# Patient Record
Sex: Female | Born: 1980 | Race: Black or African American | Hispanic: No | Marital: Married | Smoking: Never smoker
Health system: Southern US, Community
[De-identification: ages and names within clinical notes are randomized; demographics above are authoritative.]

## PROBLEM LIST (undated history)

## (undated) DIAGNOSIS — D649 Anemia, unspecified: Secondary | ICD-10-CM

## (undated) DIAGNOSIS — M1990 Unspecified osteoarthritis, unspecified site: Secondary | ICD-10-CM

## (undated) DIAGNOSIS — T7840XA Allergy, unspecified, initial encounter: Secondary | ICD-10-CM

## (undated) DIAGNOSIS — J069 Acute upper respiratory infection, unspecified: Secondary | ICD-10-CM

## (undated) DIAGNOSIS — T783XXA Angioneurotic edema, initial encounter: Secondary | ICD-10-CM

## (undated) DIAGNOSIS — L509 Urticaria, unspecified: Secondary | ICD-10-CM

## (undated) DIAGNOSIS — K219 Gastro-esophageal reflux disease without esophagitis: Secondary | ICD-10-CM

## (undated) HISTORY — DX: Allergy, unspecified, initial encounter: T78.40XA

## (undated) HISTORY — PX: TUBAL LIGATION: SHX77

## (undated) HISTORY — DX: Acute upper respiratory infection, unspecified: J06.9

## (undated) HISTORY — DX: Urticaria, unspecified: L50.9

## (undated) HISTORY — DX: Gastro-esophageal reflux disease without esophagitis: K21.9

## (undated) HISTORY — DX: Anemia, unspecified: D64.9

## (undated) HISTORY — DX: Angioneurotic edema, initial encounter: T78.3XXA

## (undated) HISTORY — PX: FOOT SURGERY: SHX648

## (undated) HISTORY — DX: Unspecified osteoarthritis, unspecified site: M19.90

---

## 2005-04-03 ENCOUNTER — Inpatient Hospital Stay (HOSPITAL_COMMUNITY): Admission: AD | Admit: 2005-04-03 | Discharge: 2005-04-06 | Payer: Self-pay | Admitting: Obstetrics and Gynecology

## 2005-05-18 ENCOUNTER — Other Ambulatory Visit: Admission: RE | Admit: 2005-05-18 | Discharge: 2005-05-18 | Payer: Self-pay | Admitting: Obstetrics and Gynecology

## 2005-06-01 ENCOUNTER — Ambulatory Visit (HOSPITAL_COMMUNITY): Admission: RE | Admit: 2005-06-01 | Discharge: 2005-06-01 | Payer: Self-pay | Admitting: Obstetrics and Gynecology

## 2006-06-01 ENCOUNTER — Other Ambulatory Visit: Admission: RE | Admit: 2006-06-01 | Discharge: 2006-06-01 | Payer: Self-pay | Admitting: Obstetrics and Gynecology

## 2010-12-12 ENCOUNTER — Other Ambulatory Visit: Payer: Self-pay | Admitting: Obstetrics and Gynecology

## 2011-10-19 ENCOUNTER — Telehealth: Payer: Self-pay

## 2011-10-19 NOTE — Telephone Encounter (Signed)
PT WOULD LIKE TO COME BY AND P/U HER IMMUNIZATIONS. PLEASE CALL [phone]

## 2011-10-20 NOTE — Telephone Encounter (Signed)
No immunizations in patient chart. Left message on patient machine.

## 2011-11-12 ENCOUNTER — Encounter: Payer: Self-pay | Admitting: Family Medicine

## 2015-10-11 DIAGNOSIS — B373 Candidiasis of vulva and vagina: Secondary | ICD-10-CM | POA: Diagnosis not present

## 2015-10-11 DIAGNOSIS — N760 Acute vaginitis: Secondary | ICD-10-CM | POA: Diagnosis not present

## 2015-11-07 DIAGNOSIS — L700 Acne vulgaris: Secondary | ICD-10-CM | POA: Diagnosis not present

## 2015-12-05 DIAGNOSIS — L700 Acne vulgaris: Secondary | ICD-10-CM | POA: Diagnosis not present

## 2015-12-21 ENCOUNTER — Ambulatory Visit (INDEPENDENT_AMBULATORY_CARE_PROVIDER_SITE_OTHER): Payer: BLUE CROSS/BLUE SHIELD | Admitting: Physician Assistant

## 2015-12-21 VITALS — BP 118/80 | HR 71 | Temp 98.6°F | Resp 16 | Ht 66.0 in | Wt 166.0 lb

## 2015-12-21 DIAGNOSIS — R8290 Unspecified abnormal findings in urine: Secondary | ICD-10-CM | POA: Diagnosis not present

## 2015-12-21 DIAGNOSIS — B379 Candidiasis, unspecified: Secondary | ICD-10-CM | POA: Diagnosis not present

## 2015-12-21 DIAGNOSIS — R350 Frequency of micturition: Secondary | ICD-10-CM

## 2015-12-21 DIAGNOSIS — N898 Other specified noninflammatory disorders of vagina: Secondary | ICD-10-CM | POA: Diagnosis not present

## 2015-12-21 LAB — POC MICROSCOPIC URINALYSIS (UMFC)

## 2015-12-21 LAB — POCT URINALYSIS DIP (MANUAL ENTRY)
Bilirubin, UA: NEGATIVE
Glucose, UA: NEGATIVE
Ketones, POC UA: NEGATIVE
NITRITE UA: NEGATIVE
PH UA: 6.5
PROTEIN UA: NEGATIVE
Spec Grav, UA: 1.02
UROBILINOGEN UA: 0.2

## 2015-12-21 LAB — POCT WET + KOH PREP
Trich by wet prep: ABSENT
YEAST BY WET PREP: ABSENT

## 2015-12-21 MED ORDER — FLUCONAZOLE 150 MG PO TABS: 150.0000 mg | ORAL_TABLET | Freq: Once | ORAL | Status: DC

## 2015-12-21 NOTE — Patient Instructions (Signed)
     IF you received an x-ray today, you will receive an invoice from [facility]. Please contact [facility] at [phone] with questions or concerns regarding your invoice.   IF you received labwork today, you will receive an invoice from [facility]. Please contact [facility] at [phone] with questions or concerns regarding your invoice.   Our billing staff will not be able to assist you with questions regarding bills from these companies.  You will be contacted with the lab results as soon as they are available. The fastest way to get your results is to activate your My Chart account. Instructions are located on the last page of this paperwork. If you have not heard from us regarding the results in 2 weeks, please contact this office.      

## 2015-12-21 NOTE — Progress Notes (Signed)
Roberta Stewart  MRN: [id number] DOB: December 23, 1980  Subjective:  Pt presents to clinic with concerns that she might have BV.  She has had the symptoms for about a week but she is traveling out of town and does not want to travel with this.  She is having vaginal discharge that is slightly yellow in color and thicker than normal.  She is sexually active with her husband and she is not worried about STDs - she has been tested within the last 6 months.  She did have some urinary frequency but she thinks that it was related to her trial of spironolactone for acne.  Last diagnosis of BV was about 2 months ago - they have talked about boric acid but they have not done it yet.  There are no active problems to display for this patient.   No current outpatient prescriptions on file prior to visit.   No current facility-administered medications on file prior to visit.    No Known Allergies  Review of Systems  Constitutional: Negative for fever and chills.  Genitourinary: Positive for vaginal discharge (yellowish thick - some odor, slight itching). Negative for dysuria, urgency and frequency.   Objective:  BP 118/80 mmHg  Pulse 71  Temp(Src) 98.6 F (37 C) (Oral)  Resp 16  Ht  (1.676 m)  Wt 166 lb (75.297 kg)  BMI 26.81 kg/m2  SpO2 99%  LMP 12/04/2015 (Approximate)  Physical Exam  Constitutional: She is oriented to person, place, and time and well-developed, well-nourished, and in no distress.  HENT:  Head: Normocephalic and atraumatic.  Right Ear: Hearing and external ear normal.  Left Ear: Hearing and external ear normal.  Eyes: Conjunctivae are normal.  Neck: Normal range of motion.  Pulmonary/Chest: Effort normal.  Neurological: She is alert and oriented to person, place, and time. Gait normal.  Skin: Skin is warm and dry.  Psychiatric: Mood, memory, affect and judgment normal.  Vitals reviewed.   Results for orders placed or performed in visit on 12/21/15  POCT  Microscopic Urinalysis ([facility])  Result Value Ref Range   WBC,UR,HPF,POC Few (A) None WBC/hpf   RBC,UR,HPF,POC Few (A) None RBC/hpf   Bacteria Few (A) None, Too numerous to count   Mucus Present (A) Absent   Epithelial Cells, UR Per Microscopy Few (A) None, Too numerous to count cells/hpf  POCT urinalysis dipstick  Result Value Ref Range   Color, UA yellow yellow   Clarity, UA clear clear   Glucose, UA negative negative   Bilirubin, UA negative negative   Ketones, POC UA negative negative   Spec Grav, UA 1.020    Blood, UA trace-intact (A) negative   pH, UA 6.5    Protein Ur, POC negative negative   Urobilinogen, UA 0.2    Nitrite, UA Negative Negative   Leukocytes, UA small (1+) (A) Negative  POCT Wet + KOH Prep  Result Value Ref Range   Yeast by KOH Present Present, Absent   Yeast by wet prep Absent Present, Absent   WBC by wet prep Few None, Few, Too numerous to count   Clue Cells Wet Prep HPF POC None None, Too numerous to count   Trich by wet prep Absent Present, Absent   Bacteria Wet Prep HPF POC Moderate (A) None, Few, Too numerous to count   Epithelial Cells By [employer] ([facility]) Few None, Few, Too numerous to count   RBC,UR,HPF,POC None None RBC/hpf    Assessment and Plan :  Abnormal  urine odor - Plan: POCT Microscopic Urinalysis ([facility]), POCT urinalysis dipstick - normal urine - not a clean catch - expect her urine frquency to be related to her diuretic  Urinary frequency -   Vaginal discharge - Plan: POCT Wet + KOH Prep  Yeast infection - Plan: fluconazole (DIFLUCAN) 150 MG tablet, Care order/instruction     Benny LennertSarah Dillinger Aston PA-C  [facility] 12/21/2015 10:40 AM

## 2016-02-24 DIAGNOSIS — R1030 Lower abdominal pain, unspecified: Secondary | ICD-10-CM | POA: Diagnosis not present

## 2016-02-26 DIAGNOSIS — Z6826 Body mass index (BMI) 26.0-26.9, adult: Secondary | ICD-10-CM | POA: Diagnosis not present

## 2016-02-26 DIAGNOSIS — R350 Frequency of micturition: Secondary | ICD-10-CM | POA: Diagnosis not present

## 2016-02-26 DIAGNOSIS — Z01419 Encounter for gynecological examination (general) (routine) without abnormal findings: Secondary | ICD-10-CM | POA: Diagnosis not present

## 2016-02-26 DIAGNOSIS — Z1151 Encounter for screening for human papillomavirus (HPV): Secondary | ICD-10-CM | POA: Diagnosis not present

## 2016-02-26 DIAGNOSIS — N898 Other specified noninflammatory disorders of vagina: Secondary | ICD-10-CM | POA: Diagnosis not present

## 2016-03-03 DIAGNOSIS — R350 Frequency of micturition: Secondary | ICD-10-CM | POA: Diagnosis not present

## 2016-03-03 DIAGNOSIS — R3915 Urgency of urination: Secondary | ICD-10-CM | POA: Diagnosis not present

## 2016-03-23 DIAGNOSIS — Z0000 Encounter for general adult medical examination without abnormal findings: Secondary | ICD-10-CM | POA: Diagnosis not present

## 2016-05-04 DIAGNOSIS — R0981 Nasal congestion: Secondary | ICD-10-CM | POA: Diagnosis not present

## 2016-05-15 DIAGNOSIS — J3081 Allergic rhinitis due to animal (cat) (dog) hair and dander: Secondary | ICD-10-CM | POA: Diagnosis not present

## 2016-05-15 DIAGNOSIS — J320 Chronic maxillary sinusitis: Secondary | ICD-10-CM | POA: Diagnosis not present

## 2016-05-15 DIAGNOSIS — J301 Allergic rhinitis due to pollen: Secondary | ICD-10-CM | POA: Diagnosis not present

## 2016-05-15 DIAGNOSIS — J321 Chronic frontal sinusitis: Secondary | ICD-10-CM | POA: Diagnosis not present

## 2016-05-20 DIAGNOSIS — J301 Allergic rhinitis due to pollen: Secondary | ICD-10-CM | POA: Diagnosis not present

## 2016-05-20 DIAGNOSIS — J040 Acute laryngitis: Secondary | ICD-10-CM | POA: Diagnosis not present

## 2016-05-20 DIAGNOSIS — J039 Acute tonsillitis, unspecified: Secondary | ICD-10-CM | POA: Diagnosis not present

## 2016-06-15 DIAGNOSIS — N920 Excessive and frequent menstruation with regular cycle: Secondary | ICD-10-CM | POA: Diagnosis not present

## 2016-06-15 DIAGNOSIS — Z113 Encounter for screening for infections with a predominantly sexual mode of transmission: Secondary | ICD-10-CM | POA: Diagnosis not present

## 2016-06-25 DIAGNOSIS — J301 Allergic rhinitis due to pollen: Secondary | ICD-10-CM | POA: Diagnosis not present

## 2016-06-25 DIAGNOSIS — J3081 Allergic rhinitis due to animal (cat) (dog) hair and dander: Secondary | ICD-10-CM | POA: Diagnosis not present

## 2016-06-25 DIAGNOSIS — H6123 Impacted cerumen, bilateral: Secondary | ICD-10-CM | POA: Diagnosis not present

## 2016-06-25 DIAGNOSIS — H6061 Unspecified chronic otitis externa, right ear: Secondary | ICD-10-CM | POA: Diagnosis not present

## 2016-06-26 DIAGNOSIS — N920 Excessive and frequent menstruation with regular cycle: Secondary | ICD-10-CM | POA: Diagnosis not present

## 2016-07-28 DIAGNOSIS — J069 Acute upper respiratory infection, unspecified: Secondary | ICD-10-CM | POA: Diagnosis not present

## 2016-08-01 DIAGNOSIS — Z713 Dietary counseling and surveillance: Secondary | ICD-10-CM | POA: Diagnosis not present

## 2016-08-01 DIAGNOSIS — Z13220 Encounter for screening for lipoid disorders: Secondary | ICD-10-CM | POA: Diagnosis not present

## 2016-08-01 DIAGNOSIS — Z136 Encounter for screening for cardiovascular disorders: Secondary | ICD-10-CM | POA: Diagnosis not present

## 2016-08-01 DIAGNOSIS — Z6826 Body mass index (BMI) 26.0-26.9, adult: Secondary | ICD-10-CM | POA: Diagnosis not present

## 2016-11-28 DIAGNOSIS — S93601A Unspecified sprain of right foot, initial encounter: Secondary | ICD-10-CM | POA: Diagnosis not present

## 2016-12-01 ENCOUNTER — Ambulatory Visit (INDEPENDENT_AMBULATORY_CARE_PROVIDER_SITE_OTHER): Payer: BLUE CROSS/BLUE SHIELD | Admitting: Orthopedic Surgery

## 2016-12-09 ENCOUNTER — Ambulatory Visit (INDEPENDENT_AMBULATORY_CARE_PROVIDER_SITE_OTHER): Payer: BLUE CROSS/BLUE SHIELD | Admitting: Orthopedic Surgery

## 2016-12-09 ENCOUNTER — Ambulatory Visit (INDEPENDENT_AMBULATORY_CARE_PROVIDER_SITE_OTHER): Payer: BLUE CROSS/BLUE SHIELD

## 2016-12-09 ENCOUNTER — Encounter (INDEPENDENT_AMBULATORY_CARE_PROVIDER_SITE_OTHER): Payer: Self-pay | Admitting: Orthopedic Surgery

## 2016-12-09 VITALS — Ht 66.0 in | Wt 166.0 lb

## 2016-12-09 DIAGNOSIS — M6701 Short Achilles tendon (acquired), right ankle: Secondary | ICD-10-CM | POA: Insufficient documentation

## 2016-12-09 DIAGNOSIS — M79671 Pain in right foot: Secondary | ICD-10-CM | POA: Diagnosis not present

## 2016-12-09 NOTE — Progress Notes (Signed)
Office Visit Note   Patient: Roberta Stewart           Date of Birth: 1981-01-02           MRN: [id number] Visit Date: 12/09/2016              Requested by: Renford DillsPolite, Ronald, MD [address] [place], [place] [address] PCP: Renford DillsPolite, Ronald, MD  Chief Complaint  Patient presents with  . Right Foot - Pain      HPI: Patient is a 36 year old woman who states she's been having right foot pain for several months she states initially she had pain around the great toe MTP joint later she had pain beneath the fifth metatarsal head which was worse with activities. Patient states that now when she crosses her legs and lays on her foot sideways she gets some pain in the third webspace. She states that her foot feels tight she has used ice and Tylenol without relief.  Assessment & Plan: Visit Diagnoses:  1. Pain in right foot   2. Achilles tendon contracture, right     Plan: Recommended heel cord stretching she should do this in that it is time 5 times a day this was demonstrated to her. Recommended sole orthotics to unload pressure from the forefoot.  Follow-Up Instructions: Return if symptoms worsen or fail to improve.   Ortho Exam  Patient is alert, oriented, no adenopathy, well-dressed, normal affect, normal respiratory effort. Examination patient has a normal gait. She has good ankle good subtalar motion she does have heel cord tightness with dorsiflexion only to neutral with her knee extended. She has good pulses. There is no pain with subtalar motion. The MTP joint of the great toe is nontender to palpation the lesser toe MTP joints are nontender to palpation. She has no pain with palpation over the web space. With lateral compression she does have a little bit of pain in the Morton's neuroma. She has no plantar calluses or ulcers.  Imaging: Xr Foot 2 Views Right  Result Date: 12/09/2016 2 view radiographs of the right foot shows a long second and third metatarsal.  Patient has no bunion deformity no bunionette deformity. She has no bipartite sesamoid. She has normal arch alignment no talar beaking.   Labs: No results found for: HGBA1C, ESRSEDRATE, CRP, LABURIC, REPTSTATUS, GRAMSTAIN, CULT, LABORGA  Orders:  Orders Placed This Encounter  Procedures  . XR Foot 2 Views Right   No orders of the defined types were placed in this encounter.    Procedures: No procedures performed  Clinical Data: No additional findings.  ROS:  All other systems negative, except as noted in the HPI. Review of Systems  Objective: Vital Signs: Ht 5\' 6"  (1.676 m)   Wt 166 lb (75.3 kg)   BMI 26.79 kg/m   Specialty Comments:  No specialty comments available.  PMFS History: Patient Active Problem List   Diagnosis Date Noted  . Pain in right foot 12/09/2016  . 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 Allergy   . Anemia   . Arthritis     Family History  Problem Relation Age of Onset  . Heart disease Maternal Grandmother   . Diabetes Maternal Grandfather   . Diabetes Paternal Grandfather     Past Surgical History:  Procedure Laterality Date  . TUBAL LIGATION     Social History   Occupational History  . Not on file.  Social History Main Topics  . Smoking status: Never Smoker  . Smokeless tobacco: Never Used  . Alcohol use 0.0 oz/week  . Drug use: No  . Sexual activity: Not on file

## 2017-01-15 DIAGNOSIS — H6123 Impacted cerumen, bilateral: Secondary | ICD-10-CM | POA: Diagnosis not present

## 2017-02-14 DIAGNOSIS — S233XXA Sprain of ligaments of thoracic spine, initial encounter: Secondary | ICD-10-CM | POA: Diagnosis not present

## 2017-03-29 DIAGNOSIS — Z01419 Encounter for gynecological examination (general) (routine) without abnormal findings: Secondary | ICD-10-CM | POA: Diagnosis not present

## 2017-03-29 DIAGNOSIS — Z1151 Encounter for screening for human papillomavirus (HPV): Secondary | ICD-10-CM | POA: Diagnosis not present

## 2017-03-29 DIAGNOSIS — Z6826 Body mass index (BMI) 26.0-26.9, adult: Secondary | ICD-10-CM | POA: Diagnosis not present

## 2017-03-30 DIAGNOSIS — E559 Vitamin D deficiency, unspecified: Secondary | ICD-10-CM | POA: Diagnosis not present

## 2017-03-30 DIAGNOSIS — M84374A Stress fracture, right foot, initial encounter for fracture: Secondary | ICD-10-CM | POA: Diagnosis not present

## 2017-04-08 DIAGNOSIS — M84374D Stress fracture, right foot, subsequent encounter for fracture with routine healing: Secondary | ICD-10-CM | POA: Diagnosis not present

## 2017-04-08 DIAGNOSIS — E559 Vitamin D deficiency, unspecified: Secondary | ICD-10-CM | POA: Diagnosis not present

## 2017-05-04 DIAGNOSIS — Z13220 Encounter for screening for lipoid disorders: Secondary | ICD-10-CM | POA: Diagnosis not present

## 2017-05-04 DIAGNOSIS — Z0000 Encounter for general adult medical examination without abnormal findings: Secondary | ICD-10-CM | POA: Diagnosis not present

## 2017-05-04 DIAGNOSIS — Z23 Encounter for immunization: Secondary | ICD-10-CM | POA: Diagnosis not present

## 2017-05-04 DIAGNOSIS — E559 Vitamin D deficiency, unspecified: Secondary | ICD-10-CM | POA: Diagnosis not present

## 2017-08-21 DIAGNOSIS — Z13220 Encounter for screening for lipoid disorders: Secondary | ICD-10-CM | POA: Diagnosis not present

## 2017-08-21 DIAGNOSIS — Z713 Dietary counseling and surveillance: Secondary | ICD-10-CM | POA: Diagnosis not present

## 2017-08-21 DIAGNOSIS — Z6829 Body mass index (BMI) 29.0-29.9, adult: Secondary | ICD-10-CM | POA: Diagnosis not present

## 2017-08-21 DIAGNOSIS — Z136 Encounter for screening for cardiovascular disorders: Secondary | ICD-10-CM | POA: Diagnosis not present

## 2017-09-27 DIAGNOSIS — J069 Acute upper respiratory infection, unspecified: Secondary | ICD-10-CM | POA: Diagnosis not present

## 2017-09-27 DIAGNOSIS — R5383 Other fatigue: Secondary | ICD-10-CM | POA: Diagnosis not present

## 2017-09-27 DIAGNOSIS — M2560 Stiffness of unspecified joint, not elsewhere classified: Secondary | ICD-10-CM | POA: Diagnosis not present

## 2017-09-27 DIAGNOSIS — F439 Reaction to severe stress, unspecified: Secondary | ICD-10-CM | POA: Diagnosis not present

## 2017-10-12 DIAGNOSIS — N760 Acute vaginitis: Secondary | ICD-10-CM | POA: Diagnosis not present

## 2017-11-23 DIAGNOSIS — D171 Benign lipomatous neoplasm of skin and subcutaneous tissue of trunk: Secondary | ICD-10-CM | POA: Diagnosis not present

## 2017-12-10 DIAGNOSIS — R3915 Urgency of urination: Secondary | ICD-10-CM | POA: Diagnosis not present

## 2017-12-10 DIAGNOSIS — B373 Candidiasis of vulva and vagina: Secondary | ICD-10-CM | POA: Diagnosis not present

## 2018-02-20 DIAGNOSIS — T63441A Toxic effect of venom of bees, accidental (unintentional), initial encounter: Secondary | ICD-10-CM | POA: Diagnosis not present

## 2018-02-24 ENCOUNTER — Encounter (HOSPITAL_COMMUNITY): Payer: Self-pay | Admitting: Emergency Medicine

## 2018-02-24 ENCOUNTER — Emergency Department (HOSPITAL_COMMUNITY)
Admission: EM | Admit: 2018-02-24 | Discharge: 2018-02-25 | Disposition: A | Discharge status: Home / Self Care | Payer: BLUE CROSS/BLUE SHIELD | Attending: Emergency Medicine | Admitting: Emergency Medicine

## 2018-02-24 ENCOUNTER — Other Ambulatory Visit: Payer: Self-pay

## 2018-02-24 ENCOUNTER — Emergency Department (HOSPITAL_COMMUNITY): Payer: BLUE CROSS/BLUE SHIELD

## 2018-02-24 DIAGNOSIS — Z79899 Other long term (current) drug therapy: Secondary | ICD-10-CM | POA: Insufficient documentation

## 2018-02-24 DIAGNOSIS — M79661 Pain in right lower leg: Secondary | ICD-10-CM | POA: Diagnosis not present

## 2018-02-24 DIAGNOSIS — L03115 Cellulitis of right lower limb: Secondary | ICD-10-CM

## 2018-02-24 DIAGNOSIS — B379 Candidiasis, unspecified: Secondary | ICD-10-CM

## 2018-02-24 DIAGNOSIS — T63461D Toxic effect of venom of wasps, accidental (unintentional), subsequent encounter: Secondary | ICD-10-CM | POA: Diagnosis not present

## 2018-02-24 DIAGNOSIS — T7840XA Allergy, unspecified, initial encounter: Secondary | ICD-10-CM | POA: Diagnosis not present

## 2018-02-24 DIAGNOSIS — M25561 Pain in right knee: Secondary | ICD-10-CM | POA: Diagnosis not present

## 2018-02-24 DIAGNOSIS — R0602 Shortness of breath: Secondary | ICD-10-CM | POA: Diagnosis not present

## 2018-02-24 MED ORDER — OXYCODONE-ACETAMINOPHEN 5-325 MG PO TABS
1.0000 | ORAL_TABLET | Freq: Once | ORAL | Status: AC
  Administered 2018-02-24: 1 via ORAL
  Filled 2018-02-24: qty 1

## 2018-02-24 MED ORDER — ONDANSETRON 4 MG PO TBDP
4.0000 mg | ORAL_TABLET | Freq: Once | ORAL | Status: AC
  Administered 2018-02-24: 4 mg via ORAL
  Filled 2018-02-24: qty 1

## 2018-02-24 NOTE — ED Provider Notes (Signed)
[facility]-EMERGENCY DEPT Provider Note  CSN: [id number] Arrival date & time: 02/24/18  2054   History   Chief Complaint Chief Complaint  Patient presents with  . Leg Pain    HPI Honor Junesiffany B Millstein is a 37 y.o. female with a medical history of anemia who presented to the ED for right leg pain x2 weeks. She describes redness, warmth and pain with walking. Patient reports being stung by a yellow jacket on the posterior aspect of her right leg 1.5 weeks ago. Since then, she reports increased swelling and redness at the site. She reports being treated for cellulitis by PCP on 02/20/18 for this, but states that the pain in her right leg has worsened and it is now painful to ambulate. She also endorses calf pain and swelling. Patient reports that she is sedentary with her job and is currently on birth control. Denies fever, chest pain, SOB, palpitations, new skin rashes/lesions and other arthralgias.  Past Medical History:  Diagnosis Date  . Allergy   . Anemia   . Arthritis     Patient Active Problem List   Diagnosis Date Noted  . Pain in right foot 12/09/2016  . 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 FOOT SURGERY    . TUBAL LIGATION       OB History   None      Home Medications    Prior to Admission medications   Medication Sig Start Date End Date Taking? Authorizing Provider  cetirizine (ZYRTEC) 10 MG tablet Take 5 mg by mouth daily.   Yes [provider]  fluticasone (FLONASE) 50 MCG/ACT nasal spray Place into both nostrils daily.   Yes [provider]  JUNEL FE 24 1-20 MG-MCG(24) tablet Take 1 tablet by mouth daily. 12/02/17  Yes [provider]  lactobacillus acidophilus & bulgar (LACTINEX) chewable tablet Chew 1 tablet by mouth daily.   Yes [provider]  Multiple Vitamin (MULTIVITAMIN WITH MINERALS) TABS tablet Take 1 tablet by mouth daily.   Yes  [provider]  fluconazole (DIFLUCAN) 150 MG tablet Take 1 tablet (150 mg total) by mouth once for 1 dose. Repeat in 1 week is needed 02/25/18 02/25/18  Mortis, Jerrel IvoryGabrielle I, PA-C  oxyCODONE-acetaminophen (PERCOCET/ROXICET) 5-325 MG tablet Take 2 tablets by mouth every 6 (six) hours as needed for up to 5 days for severe pain. 02/25/18 03/02/18  Mortis, Jerrel IvoryGabrielle I, PA-C  predniSONE (STERAPRED UNI-PAK 21 TAB) 10 MG (21) TBPK tablet Take by mouth daily. Take 6 tabs by mouth daily  for 2 days, then 5 tabs for 2 days, then 4 tabs for 2 days, then 3 tabs for 2 days, 2 tabs for 2 days, then 1 tab by mouth daily for 2 days 02/25/18   Jeannie FendMurphy, Laura A, PA-C    Family History Family History  Problem Relation Age of Onset  . Hypertension Mother   . Thyroid disease Mother   . Heart disease Maternal Grandmother   . Diabetes Maternal Grandfather   . Diabetes Paternal Grandfather     Social History Social History   Tobacco Use  . Smoking status: Never Smoker  . Smokeless tobacco: Never Used  Substance Use Topics  . Alcohol use: Not Currently    Alcohol/week: 0.0 standard drinks  . Drug use: No     Allergies   Keflex [cephalexin] and Darvon [propoxyphene]   Review of Systems Review of Systems  Constitutional: Negative  for chills and fever.  Genitourinary: Negative.   Musculoskeletal: Positive for arthralgias, gait problem and joint swelling.  Skin: Positive for color change.  Neurological: Negative for weakness and numbness.  Hematological: Negative.    Physical Exam Updated Vital Signs BP 118/79   Pulse 79   Temp 97.8 F (36.6 C) (Oral)   Resp 15   Ht 5\' 8"  (1.727 m)   Wt 77.1 kg   LMP 02/03/2018 (Exact Date)   SpO2 99%   BMI 25.85 kg/m   Physical Exam  Constitutional: She appears well-developed and well-nourished.  Grimacing in pain  Musculoskeletal:       Right hip: Normal.       Right knee: She exhibits decreased range of motion, swelling and erythema. Tenderness  found. Lateral joint line tenderness noted.       Right ankle: Normal.       Right upper leg: Normal.       Right lower leg: She exhibits tenderness and swelling. She exhibits no bony tenderness.       Right foot: Normal.  Right knee: Lateral aspect tender to palpation. Knee joint warm to touch and mildly erythematous. No streaking seen. Decreased flexion due to pain. Right calf: Tender to palpation. Increased swelling when compared to right. Remaining lower extremity joints normal with full ROM and non-painful.   Neurological: She has normal strength. She displays no atrophy. No sensory deficit. She exhibits normal muscle tone.  Reflex Scores:      Patellar reflexes are 2+ on the right side and 2+ on the left side.      Achilles reflexes are 2+ on the right side and 2+ on the left side. Skin: Skin is warm and intact. Capillary refill takes less than 2 seconds. There is erythema.  Nursing note and vitals reviewed.  ED Treatments / Results  Labs (all labs ordered are listed, but only abnormal results are displayed) Labs Reviewed - No data to display  EKG None  Radiology Ct Angio Chest Pe W/cm &/or Wo Cm  Result Date: 02/25/2018 CLINICAL DATA:  Shortness of breath.  Bee sting 2 weeks ago. EXAM: CT ANGIOGRAPHY CHEST WITH CONTRAST TECHNIQUE: Multidetector CT imaging of the chest was performed using the standard protocol during bolus administration of intravenous contrast. Multiplanar CT image reconstructions and MIPs were obtained to evaluate the vascular anatomy. CONTRAST:  70 cc Isovue-370 intravenous. COMPARISON:  None. FINDINGS: Cardiovascular: Satisfactory opacification of the pulmonary arteries to the segmental level. No evidence of pulmonary embolism. Normal heart size. No pericardial effusion. Mediastinum/Nodes: Negative Lungs/Pleura: Negative Upper Abdomen: Negative Musculoskeletal: No acute or aggressive finding. Review of the MIP images confirms the above findings. IMPRESSION:  Negative chest CTA. Electronically Signed   By: Marnee Spring M.D.   On: 02/25/2018 10:23   Ct Knee Right Wo Contrast  Result Date: 02/24/2018 CLINICAL DATA:  Right knee pain after being stung by a Ella jacket twice. Pain radiates from right knee down the calf and up into her thigh. EXAM: CT OF THE RIGHT KNEE WITHOUT CONTRAST TECHNIQUE: Multidetector CT imaging of the RIGHT knee was performed according to the standard protocol. Multiplanar CT image reconstructions were also generated. COMPARISON:  None. FINDINGS: Bones/Joint/Cartilage No fracture, malalignment or bone destruction. Maintained joint spaces without significant chondral thinning. Ligaments Suboptimally assessed by CT. Muscles and Tendons No intramuscular edema, hemorrhage or mass. Soft tissues Soft tissue induration is noted along the anterior and lateral aspect of the knee without abnormal fluid collection. IMPRESSION: Cutaneous and  subcutaneous inflammatory response along the anterolateral aspect of the knee believed to account for soft tissue induration noted. No abnormal fluid collection, mass nor acute osseous abnormality. Electronically Signed   By: Tollie Eth M.D.   On: 02/24/2018 23:51    Procedures Procedures (including critical care time)  Medications Ordered in ED Medications  oxyCODONE-acetaminophen (PERCOCET/ROXICET) 5-325 MG per tablet 1 tablet (1 tablet Oral Given 02/24/18 2320)  ondansetron (ZOFRAN-ODT) disintegrating tablet 4 mg (4 mg Oral Given 02/24/18 2321)    Initial Impression / Assessment and Plan / ED Course  Triage vital signs and the nursing notes have been reviewed.  Pertinent labs & imaging results that were available during care of the patient were reviewed and considered in medical decision making (see chart for details).  Patient presents afebrile with right leg pain. Patient reports worsening swelling, erythema and pain despite being on steroids and antibiotics to treat cellulitis. She also endorses  right calf pain and swelling. On exam, there is mild erythema of right knee joint, but no streaking seen. She is able to bear weight and ambulate, but endorses severe pain when doing so. Right upper calf near knee is objectively swollen when compared to the left. Additional DVT risk factors include sedentary lifestyle and exogenous hormone use. Concern for DVT as well as septic arthritis. Will order appropriate imaging to evaluate for both.   Clinical Course as of Feb 26 1512  Fri Feb 25, 2018  0015 CT knee shows cellulitis around anterolateral aspect of right knee. No infection penetrating deep structures of bones or muscles. No fluid collections.   [GM]    Clinical Course User Index [GM] Mortis, Jerrel Ivory I, PA-C   Venous U/S could not be completed tonight due to ultrasound services not available at ED currently. Discussed options to return to [facility] tomorrow 02/25/18 for follow-up with U/S which patient agreed to. Case also discussed with Dr. Virgina Norfolk who agrees with discharge plans detailed below.  Final Clinical Impressions(s) / ED Diagnoses  1. Right Lower Extremity Cellulitis. Switch antibiotics from Keflex to Clindamycin as there was no improvement in symptoms after 4 days with prior antibiotics. Education provided on s/s that would warrant return to the ED. Ambulatory referral for venous U/S to rule out DVT.   Dispo: Home. After thorough clinical evaluation, this patient is determined to be medically stable and can be safely discharged with the previously mentioned treatment and/or outpatient follow-up/referral(s). At this time, there are no other apparent medical conditions that require further screening, evaluation or treatment.   Final diagnoses:  Cellulitis of right lower extremity    ED Discharge Orders         Ordered    LE VENOUS     02/25/18 0005    fluconazole (DIFLUCAN) 150 MG tablet   Once     02/25/18 0027    clindamycin (CLEOCIN) 300 MG capsule  4 times daily,    Status:  Discontinued     02/25/18 0027    oxyCODONE-acetaminophen (PERCOCET/ROXICET) 5-325 MG tablet  Every 6 hours PRN     02/25/18 0027            Windy Carina, PA-C 02/25/18 1516    Virgina Norfolk, DO 02/26/18 1706

## 2018-02-24 NOTE — ED Triage Notes (Signed)
Pt states on the Saturday before last she was stung by a yellow jacket twice  Pt states she went to urgent care this past Sunday and was started on Prednisone, Keflex, and benadryl  Pt states today she is having pain in her right knee that radiates down into her calf and up into her thigh  Tender to palpate  Pt's knee is red and warm to touch  Pt also states she is starting to get red bumps like hives on her body and face

## 2018-02-25 ENCOUNTER — Encounter (HOSPITAL_COMMUNITY): Payer: Self-pay

## 2018-02-25 ENCOUNTER — Other Ambulatory Visit: Payer: Self-pay

## 2018-02-25 ENCOUNTER — Emergency Department (HOSPITAL_BASED_OUTPATIENT_CLINIC_OR_DEPARTMENT_OTHER): Payer: BLUE CROSS/BLUE SHIELD

## 2018-02-25 ENCOUNTER — Emergency Department (HOSPITAL_COMMUNITY): Payer: BLUE CROSS/BLUE SHIELD

## 2018-02-25 ENCOUNTER — Inpatient Hospital Stay (HOSPITAL_COMMUNITY): Admission: RE | Admit: 2018-02-25 | Payer: BLUE CROSS/BLUE SHIELD | Source: Ambulatory Visit

## 2018-02-25 ENCOUNTER — Emergency Department (HOSPITAL_COMMUNITY)
Admission: EM | Admit: 2018-02-25 | Discharge: 2018-02-25 | Disposition: A | Discharge status: Home / Self Care | Payer: BLUE CROSS/BLUE SHIELD | Source: Home / Self Care | Attending: Emergency Medicine | Admitting: Emergency Medicine

## 2018-02-25 DIAGNOSIS — R0602 Shortness of breath: Secondary | ICD-10-CM | POA: Insufficient documentation

## 2018-02-25 DIAGNOSIS — R609 Edema, unspecified: Secondary | ICD-10-CM | POA: Diagnosis not present

## 2018-02-25 DIAGNOSIS — T7840XA Allergy, unspecified, initial encounter: Secondary | ICD-10-CM

## 2018-02-25 DIAGNOSIS — Z79899 Other long term (current) drug therapy: Secondary | ICD-10-CM

## 2018-02-25 LAB — COMPREHENSIVE METABOLIC PANEL
ALBUMIN: 4 g/dL (ref 3.5–5.0)
ALT: 14 U/L (ref 0–44)
AST: 17 U/L (ref 15–41)
Alkaline Phosphatase: 41 U/L (ref 38–126)
Anion gap: 11 (ref 5–15)
BUN: 14 mg/dL (ref 6–20)
CO2: 21 mmol/L — ABNORMAL LOW (ref 22–32)
CREATININE: 1 mg/dL (ref 0.44–1.00)
Calcium: 9.2 mg/dL (ref 8.9–10.3)
Chloride: 105 mmol/L (ref 98–111)
GFR calc Af Amer: >60 mL/min (ref >60)
GLUCOSE: 105 mg/dL — AB (ref 70–99)
Potassium: 3.9 mmol/L (ref 3.5–5.1)
Sodium: 137 mmol/L (ref 135–145)
Total Bilirubin: 1.3 mg/dL — ABNORMAL HIGH (ref 0.3–1.2)
Total Protein: 7 g/dL (ref 6.5–8.1)

## 2018-02-25 LAB — I-STAT TROPONIN, ED: TROPONIN I, POC: 0.01 ng/mL (ref 0.00–0.08)

## 2018-02-25 LAB — CBC WITH DIFFERENTIAL/PLATELET
ABS IMMATURE GRANULOCYTES: 0 10*3/uL (ref 0.0–0.1)
BASOS PCT: 0 %
Basophils Absolute: 0 10*3/uL (ref 0.0–0.1)
Eosinophils Absolute: 0.1 10*3/uL (ref 0.0–0.7)
Eosinophils Relative: 1 %
HEMATOCRIT: 42.3 % (ref 36.0–46.0)
Hemoglobin: 13.8 g/dL (ref 12.0–15.0)
IMMATURE GRANULOCYTES: 0 %
LYMPHS ABS: 1.2 10*3/uL (ref 0.7–4.0)
Lymphocytes Relative: 13 %
MCH: 25.7 pg — ABNORMAL LOW (ref 26.0–34.0)
MCHC: 32.6 g/dL (ref 30.0–36.0)
MCV: 78.9 fL (ref 78.0–100.0)
Monocytes Absolute: 0.4 10*3/uL (ref 0.1–1.0)
Monocytes Relative: 4 %
NEUTROS ABS: 7.7 10*3/uL (ref 1.7–7.7)
NEUTROS PCT: 82 %
PLATELETS: 250 10*3/uL (ref 150–400)
RBC: 5.36 MIL/uL — AB (ref 3.87–5.11)
RDW: 13.5 % (ref 11.5–15.5)
WBC: 9.5 10*3/uL (ref 4.0–10.5)

## 2018-02-25 LAB — C-REACTIVE PROTEIN: CRP: 1.4 mg/dL — ABNORMAL HIGH (ref <1.0)

## 2018-02-25 LAB — D-DIMER, QUANTITATIVE (NOT AT ARMC): D DIMER QUANT: 2.65 ug{FEU}/mL — AB (ref 0.00–0.50)

## 2018-02-25 LAB — I-STAT BETA HCG BLOOD, ED (MC, WL, AP ONLY): I-stat hCG, quantitative: <5 m[IU]/mL (ref <5)

## 2018-02-25 LAB — SEDIMENTATION RATE: SED RATE: 6 mm/h (ref 0–22)

## 2018-02-25 LAB — CK: Total CK: 48 U/L (ref 38–234)

## 2018-02-25 MED ORDER — IOPAMIDOL (ISOVUE-370) INJECTION 76%
100.0000 mL | Freq: Once | INTRAVENOUS | Status: AC | PRN
  Administered 2018-02-25: 70 mL via INTRAVENOUS

## 2018-02-25 MED ORDER — FAMOTIDINE IN NACL 20-0.9 MG/50ML-% IV SOLN
20.0000 mg | Freq: Once | INTRAVENOUS | Status: AC
  Administered 2018-02-25: 20 mg via INTRAVENOUS
  Filled 2018-02-25: qty 50

## 2018-02-25 MED ORDER — SODIUM CHLORIDE 0.9 % IV BOLUS
1000.0000 mL | Freq: Once | INTRAVENOUS | Status: AC
  Administered 2018-02-25: 1000 mL via INTRAVENOUS

## 2018-02-25 MED ORDER — METHYLPREDNISOLONE SODIUM SUCC 125 MG IJ SOLR
125.0000 mg | Freq: Once | INTRAMUSCULAR | Status: AC
  Administered 2018-02-25: 125 mg via INTRAVENOUS
  Filled 2018-02-25: qty 2

## 2018-02-25 MED ORDER — FLUCONAZOLE 150 MG PO TABS: 150.0000 mg | ORAL_TABLET | Freq: Once | ORAL | 0 refills | Status: AC

## 2018-02-25 MED ORDER — IOPAMIDOL (ISOVUE-370) INJECTION 76%
INTRAVENOUS | Status: AC
  Filled 2018-02-25: qty 100

## 2018-02-25 MED ORDER — DIPHENHYDRAMINE HCL 50 MG/ML IJ SOLN
25.0000 mg | Freq: Once | INTRAMUSCULAR | Status: AC
  Administered 2018-02-25: 25 mg via INTRAVENOUS
  Filled 2018-02-25: qty 1

## 2018-02-25 MED ORDER — CLINDAMYCIN HCL 300 MG PO CAPS: 300.0000 mg | ORAL_CAPSULE | Freq: Four times a day (QID) | ORAL | 0 refills | Status: DC

## 2018-02-25 MED ORDER — OXYCODONE-ACETAMINOPHEN 5-325 MG PO TABS: 2.0000 | ORAL_TABLET | Freq: Four times a day (QID) | ORAL | 0 refills | Status: AC | PRN

## 2018-02-25 MED ORDER — PREDNISONE 10 MG (21) PO TBPK: ORAL_TABLET | Freq: Every day | ORAL | 0 refills | Status: DC

## 2018-02-25 NOTE — Discharge Instructions (Addendum)
Discontinue the Keflex. Take prednisone as prescribed and complete the full course.  Continue with Benadryl and Pepcid as directed. All up with your PCP on Monday, return to ER for any worsening or concerning symptoms.

## 2018-02-25 NOTE — ED Triage Notes (Addendum)
Patient here following bee sting x 2 last week. Has taken steroids as directed and reports no relief. Swelling/hives to right arm and leg. [facility] and given percocet and antibiotic that she hasn't filled as of today. No distress. [facility]also had CT for same

## 2018-02-25 NOTE — ED Provider Notes (Signed)
[facility] Provider Note   CSN: [id number] Arrival date & time: 02/25/18  [id number]     History   Chief Complaint No chief complaint on file.   HPI Roberta Stewart is a 37 y.o. female.  37 year old female presents with complaint of swelling and rash.  She states that she was stung by yellow jacket to her right knee and right forearm 2 weeks ago, no history of previous reaction to bee stings, applied cortisone and took Benadryl for her discomfort.  Patient states she then developed redness around her right knee staying and was concerned she may have cellulitis, patient went to urgent care on Sunday (5 days ago) and was given Keflex x10 days and prednisone 3 days.  Patient completed the prednisone on Wednesday.  Patient developed a rash to her extremities and trunk as well as a sharp pain in her right upper calf and right lateral knee.  [facility] for the pain in her right knee, had a noncontrast CT of her right knee showing "Cutaneous and subcutaneous inflammatory response along the anterolateral aspect of the knee believed to account for soft tissue induration noted. No abnormal fluid collection, mass nor acute osseous abnormality." Patient reports waking up today with swelling in her hands, feet, eye lids, lips. Denies shortness of breath but states her breathing does not feel normal to her. Reports recent [place]  No other complaints or concerns.      Past Medical History:  Diagnosis Date  . Allergy   . Anemia   . Arthritis     Patient Active Problem List   Diagnosis Date Noted  . Pain in right foot 12/09/2016  . 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 FOOT SURGERY    . TUBAL LIGATION       OB History   None      Home Medications    Prior to Admission medications   Medication Sig Start Date End Date  Taking? Authorizing Provider  cetirizine (ZYRTEC) 10 MG tablet Take 5 mg by mouth daily.    [provider]  fluconazole (DIFLUCAN) 150 MG tablet Take 1 tablet (150 mg total) by mouth once for 1 dose. Repeat in 1 week is needed 02/25/18 02/25/18  Mortis, Jerrel IvoryGabrielle I, PA-C  fluticasone (FLONASE) 50 MCG/ACT nasal spray Place into both nostrils daily.    [provider]  JUNEL FE 24 1-20 MG-MCG(24) tablet Take 1 tablet by mouth daily. 12/02/17   [provider]  lactobacillus acidophilus & bulgar (LACTINEX) chewable tablet Chew 1 tablet by mouth daily.    [provider]  Multiple Vitamin (MULTIVITAMIN WITH MINERALS) TABS tablet Take 1 tablet by mouth daily.    [provider]  oxyCODONE-acetaminophen (PERCOCET/ROXICET) 5-325 MG tablet Take 2 tablets by mouth every 6 (six) hours as needed for up to 5 days for severe pain. 02/25/18 03/02/18  Mortis, Jerrel IvoryGabrielle I, PA-C  predniSONE (STERAPRED UNI-PAK 21 TAB) 10 MG (21) TBPK tablet Take by mouth daily. Take 6 tabs by mouth daily  for 2 days, then 5 tabs for 2 days, then 4 tabs for 2 days, then 3 tabs for 2 days, 2 tabs for 2 days, then 1 tab by mouth daily for 2 days 02/25/18   Jeannie FendMurphy, Laura A, PA-C    Family History Family History  Problem Relation Age of Onset  .  Hypertension Mother   . Thyroid disease Mother   . Heart disease Maternal Grandmother   . Diabetes Maternal Grandfather   . Diabetes Paternal Grandfather     Social History Social History   Tobacco Use  . Smoking status: Never Smoker  . Smokeless tobacco: Never Used  Substance Use Topics  . Alcohol use: Not Currently    Alcohol/week: 0.0 standard drinks  . Drug use: No     Allergies   Keflex [cephalexin] and Darvon [propoxyphene]   Review of Systems Review of Systems  Constitutional: Negative for chills and fever.  HENT: Negative for trouble swallowing and voice change.   Respiratory: Negative for chest tightness and shortness of  breath.   Cardiovascular: Negative for chest pain.  Gastrointestinal: Negative for abdominal pain, nausea and vomiting.  Genitourinary: Negative for dysuria.  Musculoskeletal: Positive for arthralgias, joint swelling and myalgias.  Skin: Positive for rash.  Allergic/Immunologic: Negative for immunocompromised state.  Neurological: Negative for headaches.  Hematological: Negative for adenopathy. Does not bruise/bleed easily.  Psychiatric/Behavioral: Negative for confusion.  All other systems reviewed and are negative.    Physical Exam Updated Vital Signs BP 118/77   Pulse 82   Temp 98.5 F (36.9 C) (Oral)   Resp 16   Ht 5\' 8"  (1.727 m)   Wt 80.7 kg   LMP 02/03/2018 (Exact Date)   SpO2 98%   BMI 27.06 kg/m   Physical Exam  Constitutional: She is oriented to person, place, and time. She appears well-developed and well-nourished. No distress.  HENT:  Head: Normocephalic and atraumatic.  Mouth/Throat: Oropharynx is clear and moist. No oropharyngeal exudate.  Neck: Neck supple.  Cardiovascular: Regular rhythm, normal heart sounds and intact distal pulses. Tachycardia present.  No murmur heard. Pulmonary/Chest: Effort normal and breath sounds normal. No respiratory distress.  Abdominal: Soft. She exhibits no distension. There is no tenderness.  Musculoskeletal: She exhibits tenderness. She exhibits no deformity.  Neurological: She is alert and oriented to person, place, and time.  Skin: Skin is warm and dry. Rash noted. She is not diaphoretic.     Mild swelling noted to bilateral hands and feet, upper lip, upper eye lids  Psychiatric: She has a normal mood and affect. Her behavior is normal.  Nursing note and vitals reviewed.    ED Treatments / Results  Labs (all labs ordered are listed, but only abnormal results are displayed) Labs Reviewed  CBC WITH DIFFERENTIAL/PLATELET - Abnormal; Notable for the following components:      Result Value   RBC 5.36 (*)    MCH 25.7  (*)    All other components within normal limits  COMPREHENSIVE METABOLIC PANEL - Abnormal; Notable for the following components:   CO2 21 (*)    Glucose, Bld 105 (*)    Total Bilirubin 1.3 (*)    All other components within normal limits  C-REACTIVE PROTEIN - Abnormal; Notable for the following components:   CRP 1.4 (*)    All other components within normal limits  D-DIMER, QUANTITATIVE (NOT AT [facility]) - Abnormal; Notable for the following components:   D-Dimer, Quant 2.65 (*)    All other components within normal limits  SEDIMENTATION RATE  CK  I-STAT BETA HCG BLOOD, ED ([facility], [facility], [facility] ONLY)  I-STAT TROPONIN, ED    EKG EKG Interpretation  Date/Time:  Friday February 25 2018 08:37:42 EDT Ventricular Rate:  93 PR Interval:    QRS Duration: 85 QT Interval:  353 QTC Calculation: 439 R Axis:  85 Text Interpretation:  Sinus rhythm No previous ECGs available Confirmed by Richardean Canal ([id number]) on 02/25/2018 9:56:24 AM   Radiology Ct Angio Chest Pe W/cm &/or Wo Cm  Result Date: 02/25/2018 CLINICAL DATA:  Shortness of breath.  Bee sting 2 weeks ago. EXAM: CT ANGIOGRAPHY CHEST WITH CONTRAST TECHNIQUE: Multidetector CT imaging of the chest was performed using the standard protocol during bolus administration of intravenous contrast. Multiplanar CT image reconstructions and MIPs were obtained to evaluate the vascular anatomy. CONTRAST:  70 cc Isovue-370 intravenous. COMPARISON:  None. FINDINGS: Cardiovascular: Satisfactory opacification of the pulmonary arteries to the segmental level. No evidence of pulmonary embolism. Normal heart size. No pericardial effusion. Mediastinum/Nodes: Negative Lungs/Pleura: Negative Upper Abdomen: Negative Musculoskeletal: No acute or aggressive finding. Review of the MIP images confirms the above findings. IMPRESSION: Negative chest CTA. Electronically Signed   By: Marnee Spring M.D.   On: 02/25/2018 10:23   Ct Knee Right Wo Contrast  Result Date:  02/24/2018 CLINICAL DATA:  Right knee pain after being stung by a Ella jacket twice. Pain radiates from right knee down the calf and up into her thigh. EXAM: CT OF THE RIGHT KNEE WITHOUT CONTRAST TECHNIQUE: Multidetector CT imaging of the RIGHT knee was performed according to the standard protocol. Multiplanar CT image reconstructions were also generated. COMPARISON:  None. FINDINGS: Bones/Joint/Cartilage No fracture, malalignment or bone destruction. Maintained joint spaces without significant chondral thinning. Ligaments Suboptimally assessed by CT. Muscles and Tendons No intramuscular edema, hemorrhage or mass. Soft tissues Soft tissue induration is noted along the anterior and lateral aspect of the knee without abnormal fluid collection. IMPRESSION: Cutaneous and subcutaneous inflammatory response along the anterolateral aspect of the knee believed to account for soft tissue induration noted. No abnormal fluid collection, mass nor acute osseous abnormality. Electronically Signed   By: Tollie Eth M.D.   On: 02/24/2018 23:51    Procedures Procedures (including critical care time)  Medications Ordered in ED Medications  iopamidol (ISOVUE-370) 76 % injection (has no administration in time range)  sodium chloride 0.9 % bolus 1,000 mL (0 mLs Intravenous Stopped 02/25/18 1045)  diphenhydrAMINE (BENADRYL) injection 25 mg (25 mg Intravenous Given 02/25/18 0841)  famotidine (PEPCID) IVPB 20 mg premix (0 mg Intravenous Stopped 02/25/18 1045)  methylPREDNISolone sodium succinate (SOLU-MEDROL) 125 mg/2 mL injection 125 mg (125 mg Intravenous Given 02/25/18 0841)  iopamidol (ISOVUE-370) 76 % injection 100 mL (70 mLs Intravenous Contrast Given 02/25/18 1015)     Initial Impression / Assessment and Plan / ED Course  I have reviewed the triage vital signs and the nursing notes.  Pertinent labs & imaging results that were available during my care of the patient were reviewed by me and considered in my medical  decision making (see chart for details).  Clinical Course as of Feb 25 1154  Fri Feb 25, 2018  1914 Venous doppler RLE negative for DVT and baker's cyst.   [LM]  [id number] 37 year old female presents with concerns for allergic reaction.  Patient states she was stung by bee 2 weeks ago, presented to urgent care 5 days ago for concerns for cellulitis to her right lateral and posterior knee area and was given prescription for Keflex and prednisone.  Patient presented to the emergency room yesterday for pain in her right knee and calf.  Had a noncontrast CT that showed soft tissue swelling.  Patient presents today with rash and swelling. On exam, patient was tachycardic, lung sounds clear, oropharynx clear, rash to trunk, leg, forearm,  mild swelling to upper lip, upper eye lids, hands and feet. Due to recent travel, a venous doppler of the right leg was completed and was negative for DVT. D-dimer was elevated, patient tachycardic with altered breathing feeling, CTA chest was negative for Pe. Troponin, Ck, CBC, CMP, SED rate were all without significant findings. Crp elevated at 1.4. Patient was given IV fluids, solumedrol, benadryl, pepcid and has had some improvement in her symptoms, no longer tachycardic. Patient was seen by Dr. Silverio Lay, consider allergic reaction to the Keflex, masked by the Prednisone x 3 days vs rebound reaction from the short course of prednisone. Patient will be given 12 day course of prednisone, continue with benadryl and pepcid, see PCP on Monday, return to ER for any worsening or concerning symptoms through the weekend.   [LM]    Clinical Course User Index [LM] Jeannie Fend, PA-C    Final Clinical Impressions(s) / ED Diagnoses   Final diagnoses:  Allergic reaction, initial encounter    ED Discharge Orders         Ordered    predniSONE (STERAPRED UNI-PAK 21 TAB) 10 MG (21) TBPK tablet  Daily     02/25/18 1124           Alden Hipp 02/25/18 1155    Charlynne Pander, MD 02/28/18 [id number]

## 2018-02-25 NOTE — Progress Notes (Signed)
*  Preliminary Results* Right lower extremity venous duplex completed. Right lower extremity is negative for deep vein thrombosis. There is no evidence of right Baker's cyst.  02/25/2018 9:07 AM  Blanch MediaMegan Marylan Glore

## 2018-02-25 NOTE — Discharge Instructions (Signed)
Your CT scan showed that the cellulitis remains localized to the skin level which is good. There is no sign that the infection has gone to the muscle or bone.   I am going to switch your antibiotics to Clindamycin which you can start tomorrow. You should start to notice an improvement in this over the next 2-3 days. Follow-up with a medical provider if you have one or more of the following symptoms: fever; increased redness, warmth or tenderness; pain beyond where it is today.  I have called in a prescription for Diflucan for you as well.  Given CT results, I have decreased concerns about a blood clot. However, I have placed referral information for you below so you can follow-up to get an ultrasound of your leg as an outpatient.

## 2018-02-28 DIAGNOSIS — T63444D Toxic effect of venom of bees, undetermined, subsequent encounter: Secondary | ICD-10-CM | POA: Diagnosis not present

## 2018-02-28 DIAGNOSIS — T7840XD Allergy, unspecified, subsequent encounter: Secondary | ICD-10-CM | POA: Diagnosis not present

## 2018-03-02 DIAGNOSIS — H53149 Visual discomfort, unspecified: Secondary | ICD-10-CM | POA: Diagnosis not present

## 2018-03-10 DIAGNOSIS — D171 Benign lipomatous neoplasm of skin and subcutaneous tissue of trunk: Secondary | ICD-10-CM | POA: Diagnosis not present

## 2018-03-29 ENCOUNTER — Encounter: Payer: Self-pay | Admitting: Allergy and Immunology

## 2018-03-29 ENCOUNTER — Ambulatory Visit: Payer: BLUE CROSS/BLUE SHIELD | Admitting: Allergy and Immunology

## 2018-03-29 VITALS — BP 118/80 | HR 77 | Temp 98.2°F | Resp 16 | Ht 66.25 in | Wt 176.2 lb

## 2018-03-29 DIAGNOSIS — Z889 Allergy status to unspecified drugs, medicaments and biological substances status: Secondary | ICD-10-CM | POA: Diagnosis not present

## 2018-03-29 DIAGNOSIS — T63481D Toxic effect of venom of other arthropod, accidental (unintentional), subsequent encounter: Secondary | ICD-10-CM

## 2018-03-29 DIAGNOSIS — J3089 Other allergic rhinitis: Secondary | ICD-10-CM

## 2018-03-29 DIAGNOSIS — T7840XD Allergy, unspecified, subsequent encounter: Secondary | ICD-10-CM | POA: Diagnosis not present

## 2018-03-29 DIAGNOSIS — T63481A Toxic effect of venom of other arthropod, accidental (unintentional), initial encounter: Secondary | ICD-10-CM | POA: Insufficient documentation

## 2018-03-29 MED ORDER — FLUTICASONE PROPIONATE 93 MCG/ACT NA EXHU: 2.0000 | INHALANT_SUSPENSION | Freq: Two times a day (BID) | NASAL | 5 refills | Status: DC

## 2018-03-29 MED ORDER — FLUTICASONE PROPIONATE 93 MCG/ACT NA EXHU: 2.0000 | INHALANT_SUSPENSION | Freq: Two times a day (BID) | NASAL | 5 refills | Status: AC

## 2018-03-29 NOTE — Patient Instructions (Addendum)
Allergic rhinitis with a nonallergic component  Aeroallergen avoidance measures have been discussed and provided in written form.  A prescription has been provided for [facility], 2 actuations per nostril twice a day. Proper technique has been discussed and demonstrated.  Nasal saline lavage (NeilMed) has been recommended as needed and prior to medicated nasal sprays along with instructions for proper administration.  For thick post nasal drainage, nasal congestion, and/or sinus pressure, add guaifenesin 1200 mg (Mucinex Maximum Strength) plus/minus pseudoephedrine 120 mg  twice daily as needed with adequate hydration as discussed. Pseudoephedrine is only to be used for short-term relief of nasal/sinus congestion. Long-term use is discouraged due to potential side effects.  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    Avoid first generation cephalosporins.  Given the similarity of the R1 side chains, penicillin and second-generation cephalosporins should be avoided as well.  Caution with later generation cephalosporins.  Insect sting  A laboratory order form has been provided for serum specific IgE against hymenoptera venom panel.  For now, continue avoidance of flying insects and have access to epinephrine autoinjectors in case of the sting followed by systemic symptoms.   When lab results have returned the patient will be called with further recommendations and follow up instructions.  Reducing Pollen Exposure  The American Academy of Allergy, Asthma and Immunology suggests the following steps to reduce your exposure to pollen during allergy seasons.    1. Do not hang sheets or clothing out to dry; pollen may collect on these items. 2. Do not mow lawns or spend time around freshly cut grass; mowing stirs up pollen. 3. Keep windows closed at night.  Keep car windows closed while driving. 4. Minimize morning activities outdoors, a  time when pollen counts are usually at their highest. 5. Stay indoors as much as possible when pollen counts or humidity is high and on windy days when pollen tends to remain in the air longer. 6. Use air conditioning when possible.  Many air conditioners have filters that trap the pollen spores. 7. Use a HEPA room air filter to remove pollen form the indoor air you breathe.   Control of Mold Allergen  Mold and fungi can grow on a variety of surfaces provided certain temperature and moisture conditions exist.  Outdoor molds grow on plants, decaying vegetation and soil.  The major outdoor mold, Alternaria and Cladosporium, are found in very high numbers during hot and dry conditions.  Generally, a late Summer - Fall peak is seen for common outdoor fungal spores.  Rain will temporarily lower outdoor mold spore count, but counts rise rapidly when the rainy period ends.  The most important indoor molds are Aspergillus and Penicillium.  Dark, humid and poorly ventilated basements are ideal sites for mold growth.  The next most common sites of mold growth are the bathroom and the kitchen.  Outdoor Microsoft[employer]old Control 1. Use air conditioning and keep windows closed 2. Avoid exposure to decaying vegetation. 3. Avoid leaf raking. 4. Avoid grain handling. 5. Consider wearing a face mask if working in moldy areas.  Indoor Mold Control 1. Maintain humidity below 50%. 2. Clean washable surfaces with 5% bleach solution. 3. Remove sources e.g. Contaminated carpets.

## 2018-03-29 NOTE — Assessment & Plan Note (Addendum)
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    Avoid first generation cephalosporins.  Given the similarity of the R1 side chains, penicillin and second-generation cephalosporins should be avoided as well.  Caution with later generation cephalosporins.

## 2018-03-29 NOTE — Assessment & Plan Note (Signed)
   Aeroallergen avoidance measures have been discussed and provided in written form.  A prescription has been provided for [facility], 2 actuations per nostril twice a day. Proper technique has been discussed and demonstrated.  Nasal saline lavage (NeilMed) has been recommended as needed and prior to medicated nasal sprays along with instructions for proper administration.  For thick post nasal drainage, nasal congestion, and/or sinus pressure, add guaifenesin 1200 mg (Mucinex Maximum Strength) plus/minus pseudoephedrine 120 mg  twice daily as needed with adequate hydration as discussed. Pseudoephedrine is only to be used for short-term relief of nasal/sinus congestion. Long-term use is discouraged due to potential side effects.

## 2018-03-29 NOTE — Progress Notes (Signed)
New Patient Note  RE: Roberta Stewart MRN: [id number] DOB: 1980/10/26 Date of Office Visit: 03/29/2018  Referring provider: Renford Dills, MD Primary care provider: Renford Dills, MD  Chief Complaint: Allergic Reaction and Sinus Problem   History of present illness: Roberta Stewart is a 37 y.o. female seen today in consultation requested by Renford Dills, MD.  She reports that in mid August she was stung twice by yellow jackets, once on the right arm and once on the right leg.  Approximately 1 week later she developed pruritic, large local reaction at the site of the stings.  She went to the local urgent care and was treated with Keflex and prednisone.  4 days into the course of the Keflex she developed severe pain in her right leg as well as generalized urticaria and angioedema of the face.  She was given Solu-Medrol with symptom relief. Over the past 3 years, Roberta Stewart has developed sinus infections at least twice a year.  She experiences nasal congestion, rhinorrhea, thick postnasal drainage, sinus pressure, and occasional ocular pruritus.  These symptoms occur year-round but tend to be somewhat more frequent during the springtime.  She believes that her nasal symptoms are triggered by exposure to dust, pollen, strong aromas, and rapid weather changes.  She has attempted to control the symptoms with Flonase, and occasional cetirizine.  Assessment and plan: Allergic rhinitis with a nonallergic component  Aeroallergen avoidance measures have been discussed and provided in written form.  A prescription has been provided for [facility], 2 actuations per nostril twice a day. Proper technique has been discussed and demonstrated.  Nasal saline lavage (NeilMed) has been recommended as needed and prior to medicated nasal sprays along with instructions for proper administration.  For thick post nasal drainage, nasal congestion, and/or sinus pressure, add guaifenesin 1200 mg (Mucinex Maximum  Strength) plus/minus pseudoephedrine 120 mg  twice daily as needed with adequate hydration as discussed. Pseudoephedrine is only to be used for short-term relief of nasal/sinus congestion. Long-term use is discouraged due to potential side effects.  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    Avoid first generation cephalosporins.  Given the similarity of the R1 side chains, penicillin and second-generation cephalosporins should be avoided as well.  Caution with later generation cephalosporins.  Insect sting  A laboratory order form has been provided for serum specific IgE against hymenoptera venom panel.  For now, continue avoidance of flying insects and have access to epinephrine autoinjectors in case of the sting followed by systemic symptoms.   Meds ordered this encounter  Medications  . DISCONTD: Fluticasone Propionate (XHANCE) 93 MCG/ACT EXHU    Sig: Place 2 sprays into the nose 2 (two) times daily.    Dispense:  16 mL    Refill:  5  . DISCONTD: Fluticasone Propionate (XHANCE) 93 MCG/ACT EXHU    Sig: Place 2 sprays into the nose 2 (two) times daily.    Dispense:  16 mL    Refill:  5    Best phone number [phone]  . Fluticasone Propionate (XHANCE) 93 MCG/ACT EXHU    Sig: Place 2 sprays into the nose 2 (two) times daily.    Dispense:  16 mL    Refill:  5    Best phone number [phone]    Diagnostics: Epicutaneous testing: Negative despite a positive histamine control. Intradermal testing: Positive to mold, borderline positive to [place].    Physical examination: Blood pressure 118/80, pulse 77,  temperature 98.2 F (36.8 C), temperature source Oral, resp. rate 16, height 5' 6.25" (1.683 m), weight 176 lb 3.2 oz (79.9 kg), SpO2 98 %.  General: Alert, interactive, in no acute distress. HEENT: TMs pearly gray, turbinates moderately edematous with thick discharge, post-pharynx erythematous. Neck: Supple  without lymphadenopathy. Lungs: Clear to auscultation without wheezing, rhonchi or rales. CV: Normal S1, S2 without murmurs. Abdomen: Nondistended, nontender. Skin: Warm and dry, without lesions or rashes. Extremities:  No clubbing, cyanosis or edema. Neuro:   Grossly intact.  Review of systems:  Review of systems negative except as noted in HPI / PMHx or noted below: Review of Systems  Constitutional: Negative.   HENT: Negative.   Eyes: Negative.   Respiratory: Negative.   Cardiovascular: Negative.   Gastrointestinal: Negative.   Genitourinary: Negative.   Musculoskeletal: Negative.   Skin: Negative.   Neurological: Negative.   Endo/Heme/Allergies: Negative.   Psychiatric/Behavioral: Negative.     Past medical history:  Past Medical History:  Diagnosis Date  . Allergy   . Anemia   . Angio-edema   . Arthritis   . Recurrent upper respiratory infection (URI)   . Urticaria     Past surgical history:  Past Surgical History:  Procedure Laterality Date  . FOOT SURGERY    . TUBAL LIGATION      Family history: Family History  Problem Relation Age of Onset  . Hypertension Mother   . Thyroid disease Mother   . Heart disease Maternal Grandmother   . Diabetes Maternal Grandfather   . Diabetes Paternal Grandfather     Social history: Social History   Socioeconomic History  . Marital status: Married    Spouse name: Not on file  . Number of children: Not on file  . Years of education: Not on file  . Highest education level: Not on file  Occupational History  . Not on file  Social Needs  . Financial resource strain: Not on file  . Food insecurity:    Worry: Not on file    Inability: Not on file  . Transportation needs:    Medical: Not on file    Non-medical: Not on file  Tobacco Use  . Smoking status: Never Smoker  . Smokeless tobacco: Never Used  Substance and Sexual Activity  . Alcohol use: Not Currently    Alcohol/week: 0.0 standard drinks  . Drug use:  No  . Sexual activity: Not on file  Lifestyle  . Physical activity:    Days per week: Not on file    Minutes per session: Not on file  . Stress: Not on file  Relationships  . Social connections:    Talks on phone: Not on file    Gets together: Not on file    Attends religious service: Not on file    Active member of club or organization: Not on file    Attends meetings of clubs or organizations: Not on file    Relationship status: Not on file  . Intimate partner violence:    Fear of current or ex partner: Not on file    Emotionally abused: Not on file    Physically abused: Not on file    Forced sexual activity: Not on file  Other Topics Concern  . Not on file  Social History Narrative  . Not on file   Environmental History: The patient lives in a 38 year old house with carpeting the bedroom, gas heat, and central air.  There is no known mold/water damage in  the home.  There is a dog in the home which has access to her bedroom.  She is a non-smoker.  Allergies as of 03/29/2018      Reactions   Keflex [cephalexin] Swelling   Darvon [propoxyphene] Nausea And Vomiting   darvocet      Medication List        Accurate as of 03/29/18  2:11 PM. Always use your most recent med list.          cetirizine 10 MG tablet Commonly known as:  ZYRTEC Take 5 mg by mouth daily.   Fluticasone Propionate 93 MCG/ACT Exhu Place 2 sprays into the nose 2 (two) times daily.   JUNEL FE 24 1-20 MG-MCG(24) tablet Generic drug:  Norethindrone Acetate-Ethinyl Estrad-FE Take 1 tablet by mouth daily.   lactobacillus acidophilus & bulgar chewable tablet Chew 1 tablet by mouth daily.   multivitamin with minerals Tabs tablet Take 1 tablet by mouth daily.       Known medication allergies: Allergies  Allergen Reactions  . Keflex [Cephalexin] Swelling  . Darvon [Propoxyphene] Nausea And Vomiting    darvocet    I appreciate the opportunity to take part in Roberta Stewart's care. Please do not  hesitate to contact me with questions.  Sincerely,   R. Jorene Guest, MD

## 2018-03-29 NOTE — Assessment & Plan Note (Signed)
   A laboratory order form has been provided for serum specific IgE against hymenoptera venom panel.  For now, continue avoidance of flying insects and have access to epinephrine autoinjectors in case of the sting followed by systemic symptoms.

## 2018-04-01 LAB — ALLERGEN HYMENOPTERA PANEL
Bumblebee: <0.1 kU/L
Honeybee IgE: <0.1 kU/L
Hornet, White Face, IgE: 0.18 kU/L — AB
Hornet, Yellow, IgE: <0.1 kU/L
Paper Wasp IgE: 0.6 kU/L — AB
Yellow Jacket, IgE: 0.65 kU/L — AB

## 2018-05-26 DIAGNOSIS — K30 Functional dyspepsia: Secondary | ICD-10-CM | POA: Diagnosis not present

## 2018-05-26 DIAGNOSIS — R140 Abdominal distension (gaseous): Secondary | ICD-10-CM | POA: Diagnosis not present

## 2018-05-26 DIAGNOSIS — Z13220 Encounter for screening for lipoid disorders: Secondary | ICD-10-CM | POA: Diagnosis not present

## 2018-05-26 DIAGNOSIS — Z0000 Encounter for general adult medical examination without abnormal findings: Secondary | ICD-10-CM | POA: Diagnosis not present

## 2018-05-26 DIAGNOSIS — E559 Vitamin D deficiency, unspecified: Secondary | ICD-10-CM | POA: Diagnosis not present

## 2018-06-24 DIAGNOSIS — Z1151 Encounter for screening for human papillomavirus (HPV): Secondary | ICD-10-CM | POA: Diagnosis not present

## 2018-06-24 DIAGNOSIS — Z6829 Body mass index (BMI) 29.0-29.9, adult: Secondary | ICD-10-CM | POA: Diagnosis not present

## 2018-06-24 DIAGNOSIS — Z01419 Encounter for gynecological examination (general) (routine) without abnormal findings: Secondary | ICD-10-CM | POA: Diagnosis not present

## 2018-07-04 DIAGNOSIS — Z131 Encounter for screening for diabetes mellitus: Secondary | ICD-10-CM | POA: Diagnosis not present

## 2018-07-04 DIAGNOSIS — Z6828 Body mass index (BMI) 28.0-28.9, adult: Secondary | ICD-10-CM | POA: Diagnosis not present

## 2018-07-04 DIAGNOSIS — Z136 Encounter for screening for cardiovascular disorders: Secondary | ICD-10-CM | POA: Diagnosis not present

## 2018-07-04 DIAGNOSIS — Z713 Dietary counseling and surveillance: Secondary | ICD-10-CM | POA: Diagnosis not present

## 2018-07-04 DIAGNOSIS — Z13220 Encounter for screening for lipoid disorders: Secondary | ICD-10-CM | POA: Diagnosis not present

## 2018-08-08 ENCOUNTER — Telehealth: Payer: Self-pay | Admitting: Allergy

## 2018-08-08 NOTE — Telephone Encounter (Signed)
Left Roberta Stewart message to call [facility] about her Xance Left number [phone].

## 2018-08-31 DIAGNOSIS — B373 Candidiasis of vulva and vagina: Secondary | ICD-10-CM | POA: Diagnosis not present

## 2018-08-31 DIAGNOSIS — Z1231 Encounter for screening mammogram for malignant neoplasm of breast: Secondary | ICD-10-CM | POA: Diagnosis not present

## 2018-09-18 DIAGNOSIS — J069 Acute upper respiratory infection, unspecified: Secondary | ICD-10-CM | POA: Diagnosis not present

## 2019-03-06 ENCOUNTER — Other Ambulatory Visit: Payer: Self-pay

## 2019-03-06 DIAGNOSIS — Z20822 Contact with and (suspected) exposure to covid-19: Secondary | ICD-10-CM

## 2019-03-06 DIAGNOSIS — R6889 Other general symptoms and signs: Secondary | ICD-10-CM | POA: Diagnosis not present

## 2019-03-08 LAB — NOVEL CORONAVIRUS, NAA: SARS-CoV-2, NAA: NOT DETECTED

## 2019-03-31 ENCOUNTER — Other Ambulatory Visit: Payer: Self-pay

## 2019-03-31 DIAGNOSIS — R6889 Other general symptoms and signs: Secondary | ICD-10-CM | POA: Diagnosis not present

## 2019-03-31 DIAGNOSIS — Z20822 Contact with and (suspected) exposure to covid-19: Secondary | ICD-10-CM

## 2019-04-01 LAB — NOVEL CORONAVIRUS, NAA: SARS-CoV-2, NAA: NOT DETECTED

## 2019-06-05 DIAGNOSIS — R350 Frequency of micturition: Secondary | ICD-10-CM | POA: Diagnosis not present

## 2019-06-19 ENCOUNTER — Other Ambulatory Visit: Payer: Self-pay

## 2019-06-19 DIAGNOSIS — Z0000 Encounter for general adult medical examination without abnormal findings: Secondary | ICD-10-CM | POA: Diagnosis not present

## 2019-06-19 DIAGNOSIS — Z20822 Contact with and (suspected) exposure to covid-19: Secondary | ICD-10-CM

## 2019-06-19 DIAGNOSIS — E559 Vitamin D deficiency, unspecified: Secondary | ICD-10-CM | POA: Diagnosis not present

## 2019-06-19 DIAGNOSIS — Z13220 Encounter for screening for lipoid disorders: Secondary | ICD-10-CM | POA: Diagnosis not present

## 2019-06-21 LAB — NOVEL CORONAVIRUS, NAA: SARS-CoV-2, NAA: NOT DETECTED

## 2019-07-10 ENCOUNTER — Ambulatory Visit: Payer: BC Managed Care – PPO | Attending: Internal Medicine

## 2019-07-10 DIAGNOSIS — Z20822 Contact with and (suspected) exposure to covid-19: Secondary | ICD-10-CM

## 2019-07-11 LAB — NOVEL CORONAVIRUS, NAA: SARS-CoV-2, NAA: NOT DETECTED

## 2019-07-25 DIAGNOSIS — L309 Dermatitis, unspecified: Secondary | ICD-10-CM | POA: Diagnosis not present

## 2019-07-31 DIAGNOSIS — Z1151 Encounter for screening for human papillomavirus (HPV): Secondary | ICD-10-CM | POA: Diagnosis not present

## 2019-07-31 DIAGNOSIS — Z6828 Body mass index (BMI) 28.0-28.9, adult: Secondary | ICD-10-CM | POA: Diagnosis not present

## 2019-07-31 DIAGNOSIS — Z01419 Encounter for gynecological examination (general) (routine) without abnormal findings: Secondary | ICD-10-CM | POA: Diagnosis not present

## 2019-09-02 ENCOUNTER — Other Ambulatory Visit: Payer: Self-pay

## 2019-09-02 ENCOUNTER — Ambulatory Visit
Admission: EM | Admit: 2019-09-02 | Discharge: 2019-09-02 | Disposition: A | Discharge status: Home / Self Care | Payer: BC Managed Care – PPO | Attending: Emergency Medicine | Admitting: Emergency Medicine

## 2019-09-02 DIAGNOSIS — B9689 Other specified bacterial agents as the cause of diseases classified elsewhere: Secondary | ICD-10-CM

## 2019-09-02 DIAGNOSIS — N3001 Acute cystitis with hematuria: Secondary | ICD-10-CM | POA: Insufficient documentation

## 2019-09-02 LAB — POCT URINALYSIS DIP (MANUAL ENTRY)
Bilirubin, UA: NEGATIVE
Glucose, UA: NEGATIVE mg/dL
Ketones, POC UA: NEGATIVE mg/dL
Nitrite, UA: NEGATIVE
Protein Ur, POC: NEGATIVE mg/dL
Spec Grav, UA: 1.025 (ref 1.010–1.025)
Urobilinogen, UA: 0.2 E.U./dL
pH, UA: 6.5 (ref 5.0–8.0)

## 2019-09-02 MED ORDER — FLUCONAZOLE 200 MG PO TABS: 200.0000 mg | ORAL_TABLET | Freq: Once | ORAL | 0 refills | Status: AC

## 2019-09-02 MED ORDER — NITROFURANTOIN MONOHYD MACRO 100 MG PO CAPS: 100.0000 mg | ORAL_CAPSULE | Freq: Two times a day (BID) | ORAL | 0 refills | Status: DC

## 2019-09-02 NOTE — ED Triage Notes (Signed)
Patient presents for evaluation of UTI symptoms that started x one week ago with urgency. She reports frequency started two days ago with lower abdominal pressure with voiding.  Home interventions include increased water.

## 2019-09-02 NOTE — ED Provider Notes (Signed)
[name]    CSN: [id number] Arrival date & time: 09/02/19  0935      History   Chief Complaint Chief Complaint  Patient presents with  . Abdominal Pain    HPI Roberta Stewart is a 39 y.o. female presenting for urinary urgency and some weight, frequency x2 days.  Has developed some lower abdominal pressure/discomfort when voiding only for last 2 days.  Denies vaginal or pelvic pain, back pain, fever, vaginal discharge, anogenital lesions.  LMP 2/15: Status post BTL.  Patient dorsal history UTI: States this feels similar.  Last UTI was December 2020.  Treated with Cipro by her PCP, though culture reportedly showed multiple flora per patient.  Patient had adequate resolution of symptoms thereafter.  Denies history of kidney infection, renal stone.   Past Medical History:  Diagnosis Date  . Allergy   . Anemia   . Angio-edema   . Arthritis   . Recurrent upper respiratory infection (URI)   . Urticaria     Patient Active Problem List   Diagnosis Date Noted  . Allergic rhinitis with a nonallergic component 03/29/2018  . Drug allergy 03/29/2018  . Insect sting 03/29/2018  . Pain in right foot 12/09/2016  . 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 FOOT SURGERY    . TUBAL LIGATION      OB History   No obstetric history on file.      Home Medications    Prior to Admission medications   Medication Sig Start Date End Date Taking? Authorizing Provider  fluconazole (DIFLUCAN) 200 MG tablet Take 1 tablet (200 mg total) by mouth once for 1 dose. May repeat in 72 hours if needed 09/02/19 09/02/19  Hall-Potvin, [place], PA-C  Fluticasone Propionate (XHANCE) 93 MCG/ACT EXHU Place 2 sprays into the nose 2 (two) times daily. 03/29/18   Bobbitt, Heywood Iles, MD  lactobacillus acidophilus & bulgar (LACTINEX) chewable tablet Chew 1 tablet by mouth daily.    [provider]  Multiple Vitamin  (MULTIVITAMIN WITH MINERALS) TABS tablet Take 1 tablet by mouth daily.    [provider]  nitrofurantoin, macrocrystal-monohydrate, (MACROBID) 100 MG capsule Take 1 capsule (100 mg total) by mouth 2 (two) times daily. 09/02/19   Hall-Potvin, [place], PA-C    Family History Family History  Problem Relation Age of Onset  . Hypertension Mother   . Thyroid disease Mother   . Heart disease Maternal Grandmother   . Diabetes Maternal Grandfather   . Diabetes Paternal Grandfather     Social History Social History   Tobacco Use  . Smoking status: Never Smoker  . Smokeless tobacco: Never Used  Substance Use Topics  . Alcohol use: Not Currently    Alcohol/week: 0.0 standard drinks  . Drug use: No     Allergies   Keflex [cephalexin] and Darvon [propoxyphene]   Review of Systems As per HPI   Physical Exam Triage Vital Signs ED Triage Vitals  Enc Vitals Group     BP 09/02/19 0949 131/76     Pulse Rate 09/02/19 0949 82     Resp 09/02/19 0949 16     Temp 09/02/19 0949 98.5 F (36.9 C)     Temp Source 09/02/19 0949 Oral     SpO2 09/02/19 0949 98 %     Weight --      Height --      Head Circumference --      Peak  Flow --      Pain Score 09/02/19 0954 3     Pain Loc --      Pain Edu? --      Excl. in [place]? --    No data found.  Updated Vital Signs BP 131/76 (BP Location: Left Arm)   Pulse 82   Temp 98.5 F (36.9 C) (Oral)   Resp 16   SpO2 98%   Visual Acuity Right Eye Distance:   Left Eye Distance:   Bilateral Distance:    Right Eye Near:   Left Eye Near:    Bilateral Near:     Physical Exam Constitutional:      General: She is not in acute distress. HENT:     Head: Normocephalic and atraumatic.  Eyes:     General: No scleral icterus.    Pupils: Pupils are equal, round, and reactive to light.  Cardiovascular:     Rate and Rhythm: Normal rate.  Pulmonary:     Effort: Pulmonary effort is normal.  Abdominal:     General: Bowel sounds are  normal.     Palpations: Abdomen is soft.     Tenderness: There is no abdominal tenderness. There is no right CVA tenderness, left CVA tenderness or guarding.  Skin:    Coloration: Skin is not jaundiced or pale.  Neurological:     Mental Status: She is alert and oriented to person, place, and time.      UC Treatments / Results  Labs (all labs ordered are listed, but only abnormal results are displayed) Labs Reviewed  POCT URINALYSIS DIP (MANUAL ENTRY) - Abnormal; Notable for the following components:      Result Value   Clarity, UA cloudy (*)    Blood, UA moderate (*)    Leukocytes, UA Large (3+) (*)    All other components within normal limits  URINE CULTURE    EKG   Radiology No results found.  Procedures Procedures (including critical care time)  Medications Ordered in UC Medications - No data to display  Initial Impression / Assessment and Plan / UC Course  I have reviewed the triage vital signs and the nursing notes.  Pertinent labs & imaging results that were available during my care of the patient were reviewed by me and considered in my medical decision making (see chart for details).     Afebrile, nontoxic in office today.  Urine dipstick showing moderate blood, large leukocytes-culture pending.  Patient reports anaphylaxis to Keflex: We will start Macrobid, change if needed per culture.  Patient endorsing yeast infection status post antibiotic use: Diflucan sent.  Return precautions discussed, patient verbalized understanding and is agreeable to plan. Final Clinical Impressions(s) / UC Diagnoses   Final diagnoses:  Acute cystitis with hematuria     Discharge Instructions     Take antibiotic as directed with food. Return for worsening pain, urinary symptoms, blood in urine, fever, back pain.    ED Prescriptions    Medication Sig Dispense Auth. Provider   nitrofurantoin, macrocrystal-monohydrate, (MACROBID) 100 MG capsule Take 1 capsule (100 mg  total) by mouth 2 (two) times daily. 10 capsule Hall-Potvin, [place], PA-C   fluconazole (DIFLUCAN) 200 MG tablet Take 1 tablet (200 mg total) by mouth once for 1 dose. May repeat in 72 hours if needed 2 tablet Hall-Potvin, [place], PA-C     PDMP not reviewed this encounter.   Hall-Potvin, [place], [place] 09/02/19 1122

## 2019-09-02 NOTE — Discharge Instructions (Addendum)
Take antibiotic as directed with food. Return for worsening pain, urinary symptoms, blood in urine, fever, back pain. 

## 2019-09-06 LAB — URINE CULTURE: Culture: >=100000 — AB

## 2019-09-07 ENCOUNTER — Telehealth (HOSPITAL_COMMUNITY): Payer: Self-pay | Admitting: Emergency Medicine

## 2019-09-07 ENCOUNTER — Encounter (HOSPITAL_COMMUNITY): Payer: Self-pay

## 2019-09-07 MED ORDER — FLUCONAZOLE 150 MG PO TABS: 150.0000 mg | ORAL_TABLET | Freq: Once | ORAL | 0 refills | Status: AC

## 2019-09-07 MED ORDER — CIPROFLOXACIN HCL 250 MG PO TABS: 250.0000 mg | ORAL_TABLET | Freq: Two times a day (BID) | ORAL | 0 refills | Status: AC

## 2019-09-07 NOTE — Telephone Encounter (Signed)
Pt requesting diflucan for yeast from antibiotic use. Sending per protocol.

## 2019-09-07 NOTE — Telephone Encounter (Signed)
Urine culture shows resistance to the original antibiotic prescribed, macrobid. Per Dr. Tracie Harrier, pt needs a new antibiotic. Will send ciprofloxacin 250mg  BID x3 days to pt pharmacy on record.  Attempted to reach patient. No answer at this time. Voicemail left.   If you have any questions, you may call me at [phone]

## 2019-09-13 DIAGNOSIS — R21 Rash and other nonspecific skin eruption: Secondary | ICD-10-CM | POA: Diagnosis not present

## 2019-09-13 DIAGNOSIS — L308 Other specified dermatitis: Secondary | ICD-10-CM | POA: Diagnosis not present

## 2019-09-14 DIAGNOSIS — J029 Acute pharyngitis, unspecified: Secondary | ICD-10-CM | POA: Diagnosis not present

## 2019-09-14 DIAGNOSIS — J309 Allergic rhinitis, unspecified: Secondary | ICD-10-CM | POA: Diagnosis not present

## 2019-09-14 DIAGNOSIS — N390 Urinary tract infection, site not specified: Secondary | ICD-10-CM | POA: Diagnosis not present

## 2019-09-26 DIAGNOSIS — Z1231 Encounter for screening mammogram for malignant neoplasm of breast: Secondary | ICD-10-CM | POA: Diagnosis not present

## 2019-11-23 DIAGNOSIS — B373 Candidiasis of vulva and vagina: Secondary | ICD-10-CM | POA: Diagnosis not present

## 2020-01-29 ENCOUNTER — Encounter: Payer: Self-pay | Admitting: Physician Assistant

## 2020-01-29 ENCOUNTER — Ambulatory Visit: Payer: Self-pay

## 2020-01-29 ENCOUNTER — Ambulatory Visit: Payer: BC Managed Care – PPO | Admitting: Physician Assistant

## 2020-01-29 VITALS — Ht 66.0 in | Wt 176.0 lb

## 2020-01-29 DIAGNOSIS — M25571 Pain in right ankle and joints of right foot: Secondary | ICD-10-CM

## 2020-01-29 NOTE — Progress Notes (Signed)
Office Visit Note   Patient: Roberta Stewart           Date of Birth: March 02, 1981           MRN: [id number] Visit Date: 01/29/2020              Requested by: Renford Dills, MD [address] [place],  [place] [address] PCP: Renford Dills, MD  Chief Complaint  Patient presents with  . Right Ankle - Pain    DOI 01/28/20 twist injury       HPI: This is a pleasant 39 year old woman who is here today for medial right ankle pain.  She apparently she stepped in a hole last night twisting her ankle.  She does not know whether she inverted or everted her ankle.  She was able to put weight on it last night though it was painful.  This morning she has had difficulty placing any weight on her ankle she comes in today with crutches and an Ace wrap  Assessment & Plan: Visit Diagnoses:  1. Pain in right ankle and joints of right foot     Plan Findings consistent with medial deltoid sprain.  She does have a cam walker boot I have advised her to go back in that and she may weight-bear as she feels comfortable.  She will follow-up in 2 weeks.  Follow-Up Instructions: No follow-ups on file.   Ortho Exam  Patient is alert, oriented, no adenopathy, well-dressed, normal affect, normal respiratory effort. Focused examination of her ankle demonstrates mild soft tissue swelling.  Distal CMS is intact.  She is able to fire her dorsiflexors and plantar flexors.  Some mild tenderness over the ATFL.  More tenderness over the deltoid deltoid.  No tenderness over the medial lateral malleolus.  Negative squeeze test.  No proximal fibula tenderness  Imaging: No results found. No images are attached to the encounter.  Labs: Lab Results  Component Value Date   ESRSEDRATE 6 02/25/2018   CRP 1.4 (H) 02/25/2018   REPTSTATUS 09/06/2019 FINAL 09/04/2019   CULT >=100,000 COLONIES/mL SERRATIA MARCESCENS (A) 09/04/2019   LABORGA SERRATIA MARCESCENS (A) 09/04/2019     Lab Results  Component  Value Date   ALBUMIN 4.0 02/25/2018    No results found for: MG No results found for: VD25OH  No results found for: PREALBUMIN CBC EXTENDED Latest Ref Rng & Units 02/25/2018  WBC 4.0 - 10.5 K/uL 9.5  RBC 3.87 - 5.11 MIL/uL 5.36(H)  HGB 12.0 - 15.0 g/dL 02.4  HCT 36 - 46 % 09.7  PLT 150 - 400 K/uL 250  NEUTROABS 1.7 - 7.7 K/uL 7.7  LYMPHSABS 0.7 - 4.0 K/uL 1.2     Body mass index is 28.41 kg/m.  Orders:  Orders Placed This Encounter  Procedures  . XR Ankle 2 Views Right   No orders of the defined types were placed in this encounter.    Procedures: No procedures performed  Clinical Data: No additional findings.  ROS:  All other systems negative, except as noted in the HPI. Review of Systems  Objective: Vital Signs: Ht 5\' 6"  (1.676 m)   Wt 176 lb (79.8 kg)   BMI 28.41 kg/m   Specialty Comments:  No specialty comments available.  PMFS History: Patient Active Problem List   Diagnosis Date Noted  . Allergic rhinitis with a nonallergic component 03/29/2018  . Drug allergy 03/29/2018  . Insect sting 03/29/2018  . Pain in right foot 12/09/2016  .  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 Allergy   . Anemia   . Angio-edema   . Arthritis   . Recurrent upper respiratory infection (URI)   . Urticaria     Family History  Problem Relation Age of Onset  . Hypertension Mother   . Thyroid disease Mother   . Heart disease Maternal Grandmother   . Diabetes Maternal Grandfather   . Diabetes Paternal Grandfather     Past Surgical History:  Procedure Laterality Date  . FOOT SURGERY    . TUBAL LIGATION     Social History   Occupational History  . Not on file  Tobacco Use  . Smoking status: Never Smoker  . Smokeless tobacco: Never Used  Vaping Use  . Vaping Use: Never used  Substance and Sexual Activity  . Alcohol use: Not Currently    Alcohol/week: 0.0 standard drinks  . Drug use: No  . Sexual  activity: Not on file

## 2020-01-30 ENCOUNTER — Ambulatory Visit: Payer: BC Managed Care – PPO | Admitting: Physician Assistant

## 2020-02-11 ENCOUNTER — Encounter: Payer: Self-pay | Admitting: Emergency Medicine

## 2020-02-11 ENCOUNTER — Other Ambulatory Visit: Payer: Self-pay

## 2020-02-11 ENCOUNTER — Ambulatory Visit
Admission: EM | Admit: 2020-02-11 | Discharge: 2020-02-11 | Disposition: A | Discharge status: Home / Self Care | Payer: BC Managed Care – PPO | Attending: Physician Assistant | Admitting: Physician Assistant

## 2020-02-11 DIAGNOSIS — Z1152 Encounter for screening for COVID-19: Secondary | ICD-10-CM | POA: Diagnosis not present

## 2020-02-11 DIAGNOSIS — H9201 Otalgia, right ear: Secondary | ICD-10-CM

## 2020-02-11 MED ORDER — OFLOXACIN 0.3 % OT SOLN: 10.0000 [drp] | Freq: Every day | OTIC | 0 refills | Status: AC

## 2020-02-11 MED ORDER — AZELASTINE HCL 0.1 % NA SOLN: 2.0000 | Freq: Two times a day (BID) | NASAL | 0 refills | Status: DC

## 2020-02-11 NOTE — ED Triage Notes (Signed)
Pt sts right sided earache x 3 days; pt sts painful to chew

## 2020-02-11 NOTE — ED Provider Notes (Signed)
[name]    CSN: [id number] Arrival date & time: 02/11/20  1037      History   Chief Complaint Chief Complaint  Patient presents with  . Otalgia    HPI Roberta Stewart is a 39 y.o. female.   39 year old female comes in for 3 day history of right ear pain. States area feels swollen with painful chewing. Muffled hearing. No active ear canal drainage, but did note that ear canal was wet using cotton swab. Denies dental pain. States has baseline URI symptoms she contributes to allergies, has had worse post nasal drainage the past 2 weeks. Denies fever, shortness of breath, loss of taste/smell. Did have frequent swimming prior to symptom onset.     Past Medical History:  Diagnosis Date  . Allergy   . Anemia   . Angio-edema   . Arthritis   . Recurrent upper respiratory infection (URI)   . Urticaria     Patient Active Problem List   Diagnosis Date Noted  . Allergic rhinitis with a nonallergic component 03/29/2018  . Drug allergy 03/29/2018  . Insect sting 03/29/2018  . Pain in right foot 12/09/2016  . 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 FOOT SURGERY    . TUBAL LIGATION      OB History   No obstetric history on file.      Home Medications    Prior to Admission medications   Medication Sig Start Date End Date Taking? Authorizing Provider  azelastine (ASTELIN) 0.1 % nasal spray Place 2 sprays into both nostrils 2 (two) times daily. 02/11/20   Cathie Hoops, Tawonda Legaspi V, PA-C  Fluticasone Propionate (XHANCE) 93 MCG/ACT EXHU Place 2 sprays into the nose 2 (two) times daily. 03/29/18   Bobbitt, Heywood Iles, MD  lactobacillus acidophilus & bulgar (LACTINEX) chewable tablet Chew 1 tablet by mouth daily.    [provider]  Multiple Vitamin (MULTIVITAMIN WITH MINERALS) TABS tablet Take 1 tablet by mouth daily.    [provider]  ofloxacin (FLOXIN) 0.3 % OTIC solution Place 10 drops into  the right ear daily for 7 days. 02/11/20 02/18/20  Belinda Fisher, PA-C    Family History Family History  Problem Relation Age of Onset  . Hypertension Mother   . Thyroid disease Mother   . Heart disease Maternal Grandmother   . Diabetes Maternal Grandfather   . Diabetes Paternal Grandfather     Social History Social History   Tobacco Use  . Smoking status: Never Smoker  . Smokeless tobacco: Never Used  Vaping Use  . Vaping Use: Never used  Substance Use Topics  . Alcohol use: Not Currently    Alcohol/week: 0.0 standard drinks  . Drug use: No     Allergies   Keflex [cephalexin] and Darvon [propoxyphene]   Review of Systems Review of Systems  Reason unable to perform ROS: See HPI as above.     Physical Exam Triage Vital Signs ED Triage Vitals  Enc Vitals Group     BP 02/11/20 1054 134/83     Pulse Rate 02/11/20 1054 76     Resp 02/11/20 1054 18     Temp 02/11/20 1054 98.1 F (36.7 C)     Temp Source 02/11/20 1054 Oral     SpO2 02/11/20 1054 98 %     Weight --      Height --      Head Circumference --  Peak Flow --      Pain Score 02/11/20 1055 7     Pain Loc --      Pain Edu? --      Excl. in GC? --    No data found.  Updated Vital Signs BP 134/83 (BP Location: Left Arm)   Pulse 76   Temp 98.1 F (36.7 C) (Oral)   Resp 18   SpO2 98%   Physical Exam Constitutional:      General: She is not in acute distress.    Appearance: Normal appearance. She is well-developed. She is not toxic-appearing or diaphoretic.  HENT:     Head: Normocephalic and atraumatic.     Right Ear: Tympanic membrane and external ear normal.     Left Ear: Tympanic membrane, ear canal and external ear normal. Tympanic membrane is not erythematous or bulging.     Ears:     Comments: No tragal tenderness. Right ear canal with erythema, no swelling, no active drainage.     Mouth/Throat:     Mouth: Mucous membranes are moist.     Pharynx: Oropharynx is clear. Uvula midline.    Eyes:     Conjunctiva/sclera: Conjunctivae normal.     Pupils: Pupils are equal, round, and reactive to light.  Pulmonary:     Effort: Pulmonary effort is normal. No respiratory distress.  Musculoskeletal:     Cervical back: Normal range of motion and neck supple.  Skin:    General: Skin is warm and dry.  Neurological:     Mental Status: She is alert and oriented to person, place, and time.      UC Treatments / Results  Labs (all labs ordered are listed, but only abnormal results are displayed) Labs Reviewed  NOVEL CORONAVIRUS, NAA    EKG   Radiology No results found.  Procedures Procedures (including critical care time)  Medications Ordered in UC Medications - No data to display  Initial Impression / Assessment and Plan / UC Course  I have reviewed the triage vital signs and the nursing notes.  Pertinent labs & imaging results that were available during my care of the patient were reviewed by me and considered in my medical decision making (see chart for details).    Discussed right ear canal with erythema, no swelling, drainage. No current tragal tenderness, however, patient states had been tender intermittently. Will cover for otitis externa with ofloxacin. Otherwise add azelastine for possible eustachian tube dysfunction. Patient requesting COVID testing, ordered. Though given 2 week worsening of baseline URI symptoms, if positive, may not need quarantine. Return precautions given.  Final Clinical Impressions(s) / UC Diagnoses   Final diagnoses:  Encounter for screening for COVID-19  Right ear pain    ED Prescriptions    Medication Sig Dispense Auth. Provider   azelastine (ASTELIN) 0.1 % nasal spray Place 2 sprays into both nostrils 2 (two) times daily. 30 mL Teresia Myint V, PA-C   ofloxacin (FLOXIN) 0.3 % OTIC solution Place 10 drops into the right ear daily for 7 days. 3.5 mL Belinda Fisher, PA-C     PDMP not reviewed this encounter.   Belinda Fisher, PA-C 02/11/20  1140

## 2020-02-11 NOTE — Discharge Instructions (Signed)
COVID PCR testing ordered. Continue flonase and add azelastine for possible eustachian tube dysfunction causing symptoms. As discussed, due to redness in ear canal, start ofloxacin to cover for outer ear infection. Tylenol/motrin for pain and fever. Keep hydrated, urine should be clear to pale yellow in color. If experiencing shortness of breath, trouble breathing, go to the emergency department for further evaluation needed. If ear pain not improving, follow up with ear nose and throat for further evaluation.

## 2020-02-12 LAB — NOVEL CORONAVIRUS, NAA: SARS-CoV-2, NAA: NOT DETECTED

## 2020-02-12 LAB — SARS-COV-2, NAA 2 DAY TAT

## 2020-02-16 DIAGNOSIS — B373 Candidiasis of vulva and vagina: Secondary | ICD-10-CM | POA: Diagnosis not present

## 2020-04-01 DIAGNOSIS — N760 Acute vaginitis: Secondary | ICD-10-CM | POA: Diagnosis not present

## 2020-06-04 DIAGNOSIS — Z20822 Contact with and (suspected) exposure to covid-19: Secondary | ICD-10-CM | POA: Diagnosis not present

## 2020-06-06 DIAGNOSIS — J019 Acute sinusitis, unspecified: Secondary | ICD-10-CM | POA: Diagnosis not present

## 2020-06-06 DIAGNOSIS — B373 Candidiasis of vulva and vagina: Secondary | ICD-10-CM | POA: Diagnosis not present

## 2020-06-19 DIAGNOSIS — I10 Essential (primary) hypertension: Secondary | ICD-10-CM | POA: Diagnosis not present

## 2020-06-20 IMAGING — CT CT KNEE*R* W/O CM
3 of 4 series · 15 of 33 positions shown, 19 images · non-contrast
Comparison: None.

CLINICAL DATA: Right knee pain after being stung by [employer] jacket
twice. Pain radiates from right knee down the calf and up into her
thigh.

EXAM:
CT OF THE RIGHT KNEE WITHOUT CONTRAST
TECHNIQUE: Multidetector CT imaging of the RIGHT knee was performed according
to the standard protocol. Multiplanar CT image reconstructions were
also generated.

[Series 4: axial st · axial · 0.36mm/px · z∈[-794,-648]mm · 9 of 115 slices shown, 12 images]
[im 9/115  soft-tissue]
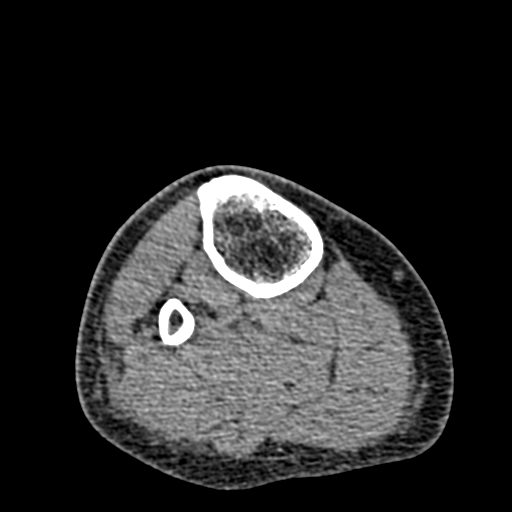
[im 9/115  bone]
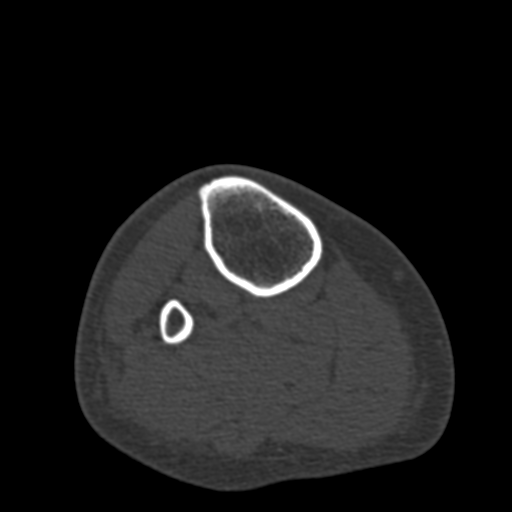
[im 27/115  bone]
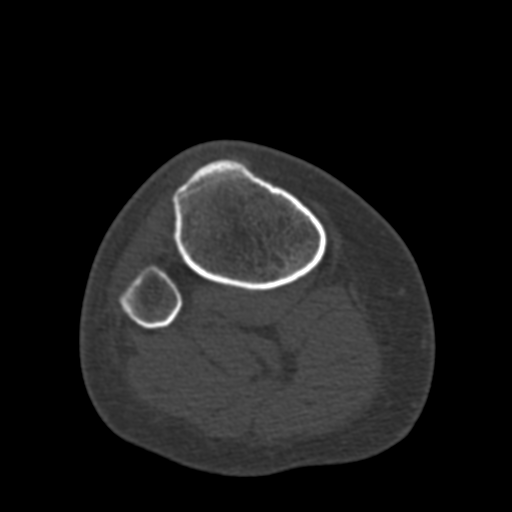
[im 36/115  bone]
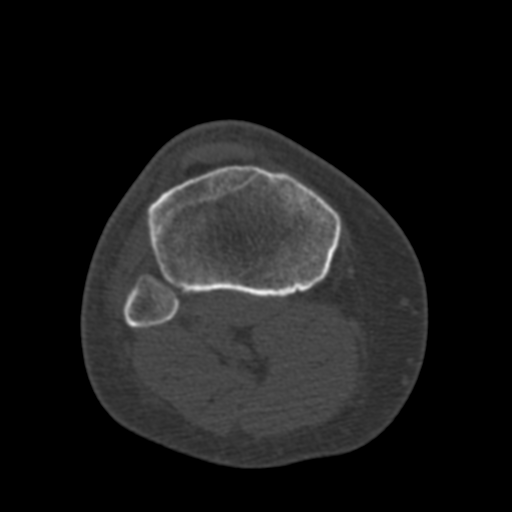
[im 44/115  bone]
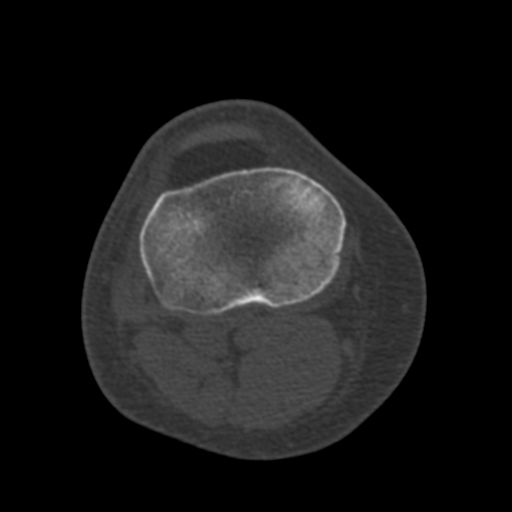
[im 62/115  soft-tissue]
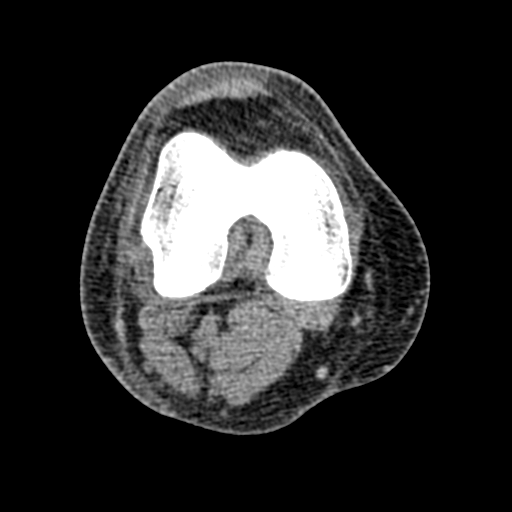
[im 62/115  bone]
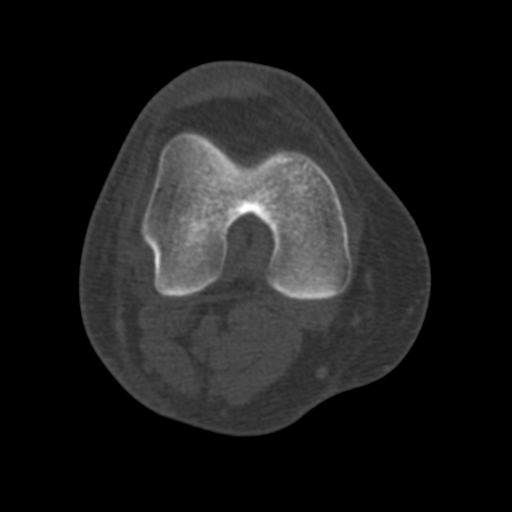
[im 71/115  bone]
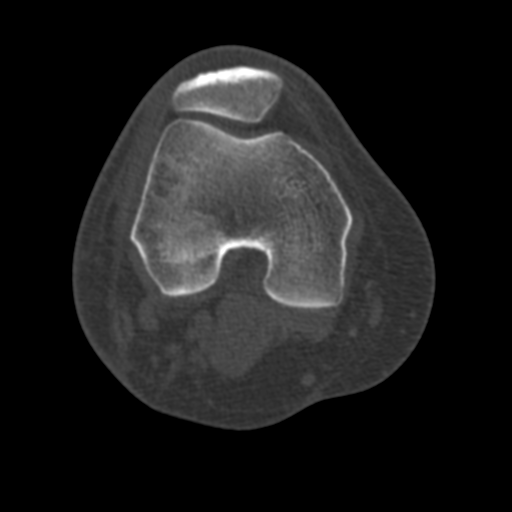
[im 79/115  bone]
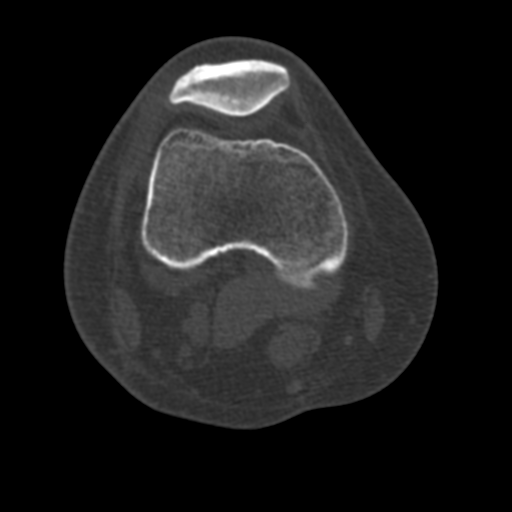
[im 97/115  bone]
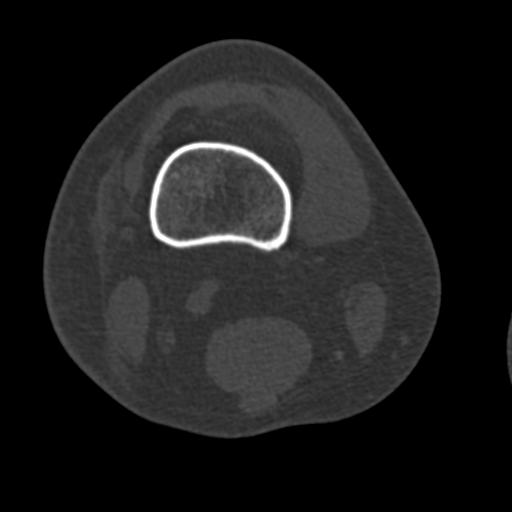
[im 106/115  soft-tissue]
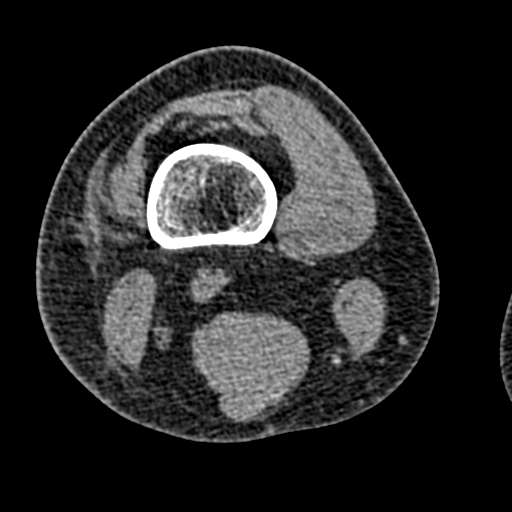
[im 106/115  bone]
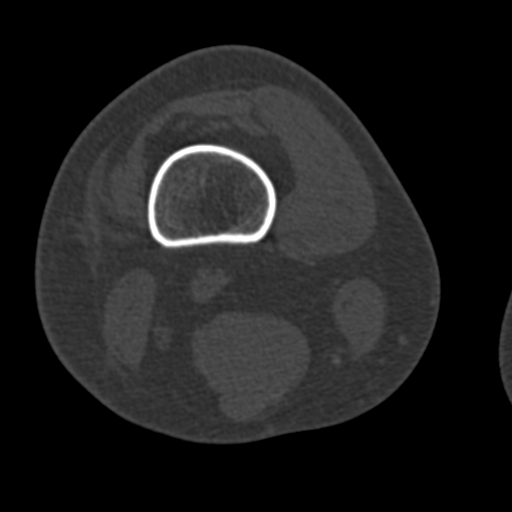

[Series 5: coronal bone · coronal · 0.27mm/px · 1 of 102 slices shown]
[im 51/102  bone]
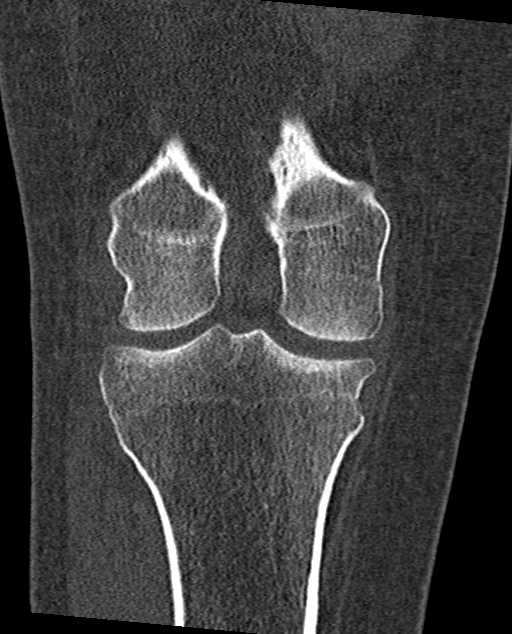

[Series 8: sagittal st · sagittal · 0.34mm/px · 5 of 81 slices shown, 6 images]
[im 27/81  bone]
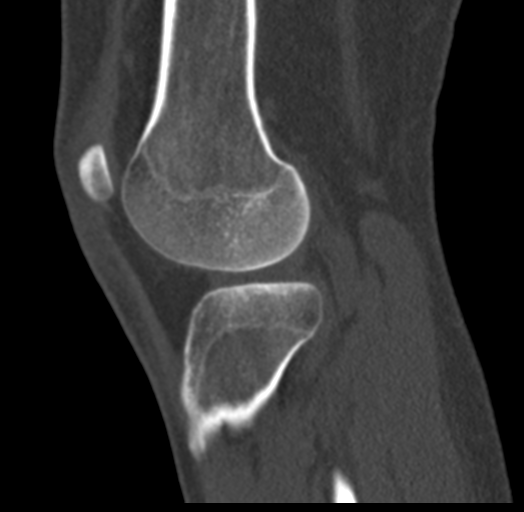
[im 34/81  bone]
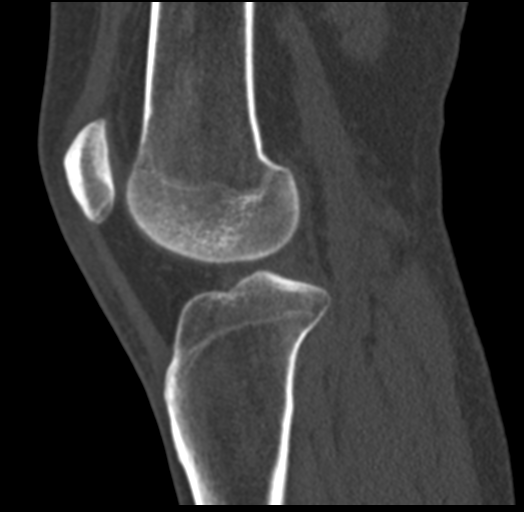
[im 41/81  soft-tissue]
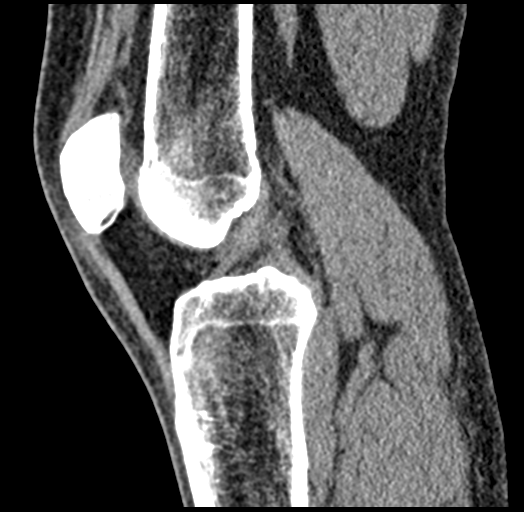
[im 41/81  bone]
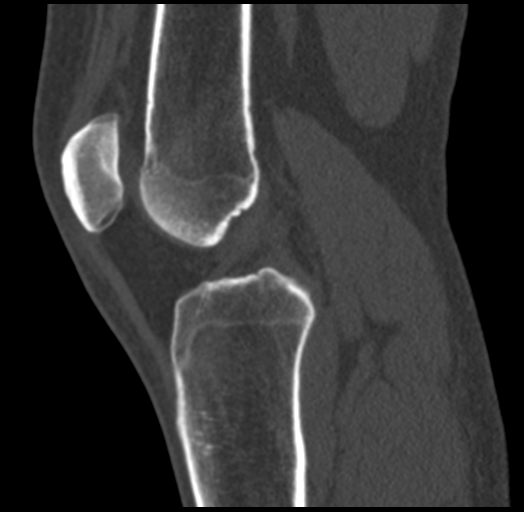
[im 47/81  bone]
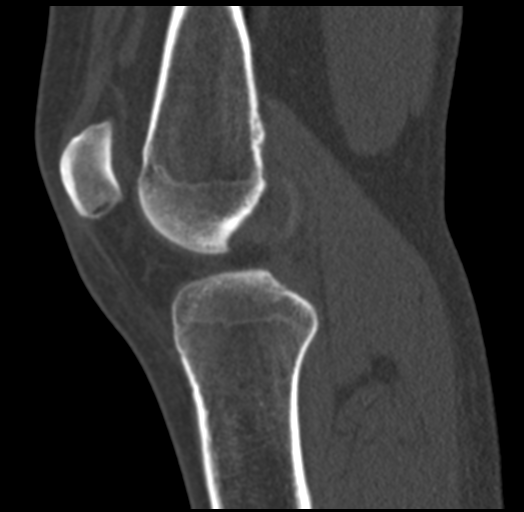
[im 54/81  bone]
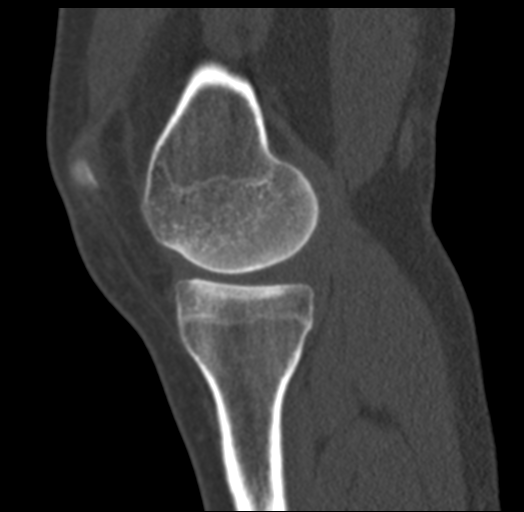

[15 of 33 positions shown; findings below may reference images not displayed]

FINDINGS: Bones/Joint/Cartilage

No fracture, malalignment or bone destruction. Maintained joint
spaces without significant chondral thinning.

Ligaments

Suboptimally assessed by CT.

Muscles and Tendons

No intramuscular edema, hemorrhage or mass.

Soft tissues

Soft tissue induration is noted along the anterior and lateral
aspect of the knee without abnormal fluid collection.
IMPRESSION: Cutaneous and subcutaneous inflammatory response along the
anterolateral aspect of the knee believed to account for soft tissue
induration noted. No abnormal fluid collection, mass nor acute
osseous abnormality.

## 2020-07-10 DIAGNOSIS — E6609 Other obesity due to excess calories: Secondary | ICD-10-CM | POA: Diagnosis not present

## 2020-07-10 DIAGNOSIS — Z713 Dietary counseling and surveillance: Secondary | ICD-10-CM | POA: Diagnosis not present

## 2020-07-19 DIAGNOSIS — Z713 Dietary counseling and surveillance: Secondary | ICD-10-CM | POA: Diagnosis not present

## 2020-07-25 DIAGNOSIS — Z0000 Encounter for general adult medical examination without abnormal findings: Secondary | ICD-10-CM | POA: Diagnosis not present

## 2020-07-25 DIAGNOSIS — E559 Vitamin D deficiency, unspecified: Secondary | ICD-10-CM | POA: Diagnosis not present

## 2020-07-25 DIAGNOSIS — Z13220 Encounter for screening for lipoid disorders: Secondary | ICD-10-CM | POA: Diagnosis not present

## 2020-07-25 DIAGNOSIS — Z1152 Encounter for screening for COVID-19: Secondary | ICD-10-CM | POA: Diagnosis not present

## 2020-07-31 DIAGNOSIS — M25511 Pain in right shoulder: Secondary | ICD-10-CM | POA: Diagnosis not present

## 2020-07-31 DIAGNOSIS — M546 Pain in thoracic spine: Secondary | ICD-10-CM | POA: Diagnosis not present

## 2020-07-31 DIAGNOSIS — Z713 Dietary counseling and surveillance: Secondary | ICD-10-CM | POA: Diagnosis not present

## 2020-08-21 DIAGNOSIS — E6609 Other obesity due to excess calories: Secondary | ICD-10-CM | POA: Diagnosis not present

## 2020-08-21 DIAGNOSIS — Z713 Dietary counseling and surveillance: Secondary | ICD-10-CM | POA: Diagnosis not present

## 2020-08-26 DIAGNOSIS — M9902 Segmental and somatic dysfunction of thoracic region: Secondary | ICD-10-CM | POA: Diagnosis not present

## 2020-08-26 DIAGNOSIS — M25552 Pain in left hip: Secondary | ICD-10-CM | POA: Diagnosis not present

## 2020-08-26 DIAGNOSIS — M9905 Segmental and somatic dysfunction of pelvic region: Secondary | ICD-10-CM | POA: Diagnosis not present

## 2020-08-26 DIAGNOSIS — M5384 Other specified dorsopathies, thoracic region: Secondary | ICD-10-CM | POA: Diagnosis not present

## 2020-08-29 DIAGNOSIS — M9905 Segmental and somatic dysfunction of pelvic region: Secondary | ICD-10-CM | POA: Diagnosis not present

## 2020-08-29 DIAGNOSIS — M25552 Pain in left hip: Secondary | ICD-10-CM | POA: Diagnosis not present

## 2020-08-29 DIAGNOSIS — M5384 Other specified dorsopathies, thoracic region: Secondary | ICD-10-CM | POA: Diagnosis not present

## 2020-08-29 DIAGNOSIS — M9902 Segmental and somatic dysfunction of thoracic region: Secondary | ICD-10-CM | POA: Diagnosis not present

## 2020-09-02 DIAGNOSIS — M9902 Segmental and somatic dysfunction of thoracic region: Secondary | ICD-10-CM | POA: Diagnosis not present

## 2020-09-02 DIAGNOSIS — M25552 Pain in left hip: Secondary | ICD-10-CM | POA: Diagnosis not present

## 2020-09-02 DIAGNOSIS — M5384 Other specified dorsopathies, thoracic region: Secondary | ICD-10-CM | POA: Diagnosis not present

## 2020-09-02 DIAGNOSIS — M9905 Segmental and somatic dysfunction of pelvic region: Secondary | ICD-10-CM | POA: Diagnosis not present

## 2020-09-03 DIAGNOSIS — M25552 Pain in left hip: Secondary | ICD-10-CM | POA: Diagnosis not present

## 2020-09-03 DIAGNOSIS — M5384 Other specified dorsopathies, thoracic region: Secondary | ICD-10-CM | POA: Diagnosis not present

## 2020-09-03 DIAGNOSIS — M9905 Segmental and somatic dysfunction of pelvic region: Secondary | ICD-10-CM | POA: Diagnosis not present

## 2020-09-03 DIAGNOSIS — M9902 Segmental and somatic dysfunction of thoracic region: Secondary | ICD-10-CM | POA: Diagnosis not present

## 2020-09-05 DIAGNOSIS — M5384 Other specified dorsopathies, thoracic region: Secondary | ICD-10-CM | POA: Diagnosis not present

## 2020-09-05 DIAGNOSIS — M9905 Segmental and somatic dysfunction of pelvic region: Secondary | ICD-10-CM | POA: Diagnosis not present

## 2020-09-05 DIAGNOSIS — M9902 Segmental and somatic dysfunction of thoracic region: Secondary | ICD-10-CM | POA: Diagnosis not present

## 2020-09-05 DIAGNOSIS — M25552 Pain in left hip: Secondary | ICD-10-CM | POA: Diagnosis not present

## 2020-09-10 DIAGNOSIS — M9902 Segmental and somatic dysfunction of thoracic region: Secondary | ICD-10-CM | POA: Diagnosis not present

## 2020-09-10 DIAGNOSIS — R61 Generalized hyperhidrosis: Secondary | ICD-10-CM | POA: Diagnosis not present

## 2020-09-10 DIAGNOSIS — M9905 Segmental and somatic dysfunction of pelvic region: Secondary | ICD-10-CM | POA: Diagnosis not present

## 2020-09-10 DIAGNOSIS — N898 Other specified noninflammatory disorders of vagina: Secondary | ICD-10-CM | POA: Diagnosis not present

## 2020-09-10 DIAGNOSIS — M25552 Pain in left hip: Secondary | ICD-10-CM | POA: Diagnosis not present

## 2020-09-10 DIAGNOSIS — N760 Acute vaginitis: Secondary | ICD-10-CM | POA: Diagnosis not present

## 2020-09-10 DIAGNOSIS — M5384 Other specified dorsopathies, thoracic region: Secondary | ICD-10-CM | POA: Diagnosis not present

## 2020-09-11 DIAGNOSIS — M9902 Segmental and somatic dysfunction of thoracic region: Secondary | ICD-10-CM | POA: Diagnosis not present

## 2020-09-11 DIAGNOSIS — M5384 Other specified dorsopathies, thoracic region: Secondary | ICD-10-CM | POA: Diagnosis not present

## 2020-09-11 DIAGNOSIS — M25552 Pain in left hip: Secondary | ICD-10-CM | POA: Diagnosis not present

## 2020-09-11 DIAGNOSIS — M9905 Segmental and somatic dysfunction of pelvic region: Secondary | ICD-10-CM | POA: Diagnosis not present

## 2020-09-16 DIAGNOSIS — M25552 Pain in left hip: Secondary | ICD-10-CM | POA: Diagnosis not present

## 2020-09-16 DIAGNOSIS — M9905 Segmental and somatic dysfunction of pelvic region: Secondary | ICD-10-CM | POA: Diagnosis not present

## 2020-09-16 DIAGNOSIS — M9902 Segmental and somatic dysfunction of thoracic region: Secondary | ICD-10-CM | POA: Diagnosis not present

## 2020-09-16 DIAGNOSIS — M5384 Other specified dorsopathies, thoracic region: Secondary | ICD-10-CM | POA: Diagnosis not present

## 2020-09-18 DIAGNOSIS — M9902 Segmental and somatic dysfunction of thoracic region: Secondary | ICD-10-CM | POA: Diagnosis not present

## 2020-09-18 DIAGNOSIS — M5384 Other specified dorsopathies, thoracic region: Secondary | ICD-10-CM | POA: Diagnosis not present

## 2020-09-18 DIAGNOSIS — M25552 Pain in left hip: Secondary | ICD-10-CM | POA: Diagnosis not present

## 2020-09-18 DIAGNOSIS — M9905 Segmental and somatic dysfunction of pelvic region: Secondary | ICD-10-CM | POA: Diagnosis not present

## 2020-09-24 DIAGNOSIS — M25552 Pain in left hip: Secondary | ICD-10-CM | POA: Diagnosis not present

## 2020-09-24 DIAGNOSIS — M9902 Segmental and somatic dysfunction of thoracic region: Secondary | ICD-10-CM | POA: Diagnosis not present

## 2020-09-24 DIAGNOSIS — M9905 Segmental and somatic dysfunction of pelvic region: Secondary | ICD-10-CM | POA: Diagnosis not present

## 2020-09-24 DIAGNOSIS — M5384 Other specified dorsopathies, thoracic region: Secondary | ICD-10-CM | POA: Diagnosis not present

## 2020-09-26 DIAGNOSIS — M5384 Other specified dorsopathies, thoracic region: Secondary | ICD-10-CM | POA: Diagnosis not present

## 2020-09-26 DIAGNOSIS — M9902 Segmental and somatic dysfunction of thoracic region: Secondary | ICD-10-CM | POA: Diagnosis not present

## 2020-09-26 DIAGNOSIS — M25552 Pain in left hip: Secondary | ICD-10-CM | POA: Diagnosis not present

## 2020-09-26 DIAGNOSIS — M9905 Segmental and somatic dysfunction of pelvic region: Secondary | ICD-10-CM | POA: Diagnosis not present

## 2020-10-01 DIAGNOSIS — M25552 Pain in left hip: Secondary | ICD-10-CM | POA: Diagnosis not present

## 2020-10-01 DIAGNOSIS — M5384 Other specified dorsopathies, thoracic region: Secondary | ICD-10-CM | POA: Diagnosis not present

## 2020-10-01 DIAGNOSIS — M9905 Segmental and somatic dysfunction of pelvic region: Secondary | ICD-10-CM | POA: Diagnosis not present

## 2020-10-01 DIAGNOSIS — M9902 Segmental and somatic dysfunction of thoracic region: Secondary | ICD-10-CM | POA: Diagnosis not present

## 2020-10-02 DIAGNOSIS — M25552 Pain in left hip: Secondary | ICD-10-CM | POA: Diagnosis not present

## 2020-10-02 DIAGNOSIS — M9905 Segmental and somatic dysfunction of pelvic region: Secondary | ICD-10-CM | POA: Diagnosis not present

## 2020-10-02 DIAGNOSIS — M9902 Segmental and somatic dysfunction of thoracic region: Secondary | ICD-10-CM | POA: Diagnosis not present

## 2020-10-02 DIAGNOSIS — M5384 Other specified dorsopathies, thoracic region: Secondary | ICD-10-CM | POA: Diagnosis not present

## 2020-10-09 DIAGNOSIS — M5384 Other specified dorsopathies, thoracic region: Secondary | ICD-10-CM | POA: Diagnosis not present

## 2020-10-09 DIAGNOSIS — M9902 Segmental and somatic dysfunction of thoracic region: Secondary | ICD-10-CM | POA: Diagnosis not present

## 2020-10-09 DIAGNOSIS — M25552 Pain in left hip: Secondary | ICD-10-CM | POA: Diagnosis not present

## 2020-10-09 DIAGNOSIS — M9905 Segmental and somatic dysfunction of pelvic region: Secondary | ICD-10-CM | POA: Diagnosis not present

## 2020-10-09 DIAGNOSIS — Z1231 Encounter for screening mammogram for malignant neoplasm of breast: Secondary | ICD-10-CM | POA: Diagnosis not present

## 2020-10-17 DIAGNOSIS — M9902 Segmental and somatic dysfunction of thoracic region: Secondary | ICD-10-CM | POA: Diagnosis not present

## 2020-10-17 DIAGNOSIS — M25552 Pain in left hip: Secondary | ICD-10-CM | POA: Diagnosis not present

## 2020-10-17 DIAGNOSIS — Z6830 Body mass index (BMI) 30.0-30.9, adult: Secondary | ICD-10-CM | POA: Diagnosis not present

## 2020-10-17 DIAGNOSIS — Z01419 Encounter for gynecological examination (general) (routine) without abnormal findings: Secondary | ICD-10-CM | POA: Diagnosis not present

## 2020-10-17 DIAGNOSIS — M9905 Segmental and somatic dysfunction of pelvic region: Secondary | ICD-10-CM | POA: Diagnosis not present

## 2020-10-17 DIAGNOSIS — M5384 Other specified dorsopathies, thoracic region: Secondary | ICD-10-CM | POA: Diagnosis not present

## 2020-10-24 DIAGNOSIS — M25552 Pain in left hip: Secondary | ICD-10-CM | POA: Diagnosis not present

## 2020-10-24 DIAGNOSIS — M9902 Segmental and somatic dysfunction of thoracic region: Secondary | ICD-10-CM | POA: Diagnosis not present

## 2020-10-24 DIAGNOSIS — M5384 Other specified dorsopathies, thoracic region: Secondary | ICD-10-CM | POA: Diagnosis not present

## 2020-10-24 DIAGNOSIS — M9905 Segmental and somatic dysfunction of pelvic region: Secondary | ICD-10-CM | POA: Diagnosis not present

## 2020-11-08 DIAGNOSIS — N898 Other specified noninflammatory disorders of vagina: Secondary | ICD-10-CM | POA: Diagnosis not present

## 2020-11-08 DIAGNOSIS — N760 Acute vaginitis: Secondary | ICD-10-CM | POA: Diagnosis not present

## 2020-11-11 DIAGNOSIS — M5384 Other specified dorsopathies, thoracic region: Secondary | ICD-10-CM | POA: Diagnosis not present

## 2020-11-11 DIAGNOSIS — M9902 Segmental and somatic dysfunction of thoracic region: Secondary | ICD-10-CM | POA: Diagnosis not present

## 2020-11-11 DIAGNOSIS — M25552 Pain in left hip: Secondary | ICD-10-CM | POA: Diagnosis not present

## 2020-11-11 DIAGNOSIS — M9905 Segmental and somatic dysfunction of pelvic region: Secondary | ICD-10-CM | POA: Diagnosis not present

## 2020-11-25 DIAGNOSIS — M9902 Segmental and somatic dysfunction of thoracic region: Secondary | ICD-10-CM | POA: Diagnosis not present

## 2020-11-25 DIAGNOSIS — M9905 Segmental and somatic dysfunction of pelvic region: Secondary | ICD-10-CM | POA: Diagnosis not present

## 2020-11-25 DIAGNOSIS — M25552 Pain in left hip: Secondary | ICD-10-CM | POA: Diagnosis not present

## 2020-11-25 DIAGNOSIS — M5384 Other specified dorsopathies, thoracic region: Secondary | ICD-10-CM | POA: Diagnosis not present

## 2020-12-04 DIAGNOSIS — M545 Low back pain, unspecified: Secondary | ICD-10-CM | POA: Diagnosis not present

## 2020-12-04 DIAGNOSIS — R202 Paresthesia of skin: Secondary | ICD-10-CM | POA: Diagnosis not present

## 2020-12-04 DIAGNOSIS — R109 Unspecified abdominal pain: Secondary | ICD-10-CM | POA: Diagnosis not present

## 2020-12-31 DIAGNOSIS — D1722 Benign lipomatous neoplasm of skin and subcutaneous tissue of left arm: Secondary | ICD-10-CM | POA: Diagnosis not present

## 2021-01-10 DIAGNOSIS — R1084 Generalized abdominal pain: Secondary | ICD-10-CM | POA: Diagnosis not present

## 2021-01-29 DIAGNOSIS — Z20822 Contact with and (suspected) exposure to covid-19: Secondary | ICD-10-CM | POA: Diagnosis not present

## 2021-01-31 DIAGNOSIS — R350 Frequency of micturition: Secondary | ICD-10-CM | POA: Diagnosis not present

## 2021-01-31 DIAGNOSIS — R3121 Asymptomatic microscopic hematuria: Secondary | ICD-10-CM | POA: Diagnosis not present

## 2021-01-31 DIAGNOSIS — R8271 Bacteriuria: Secondary | ICD-10-CM | POA: Diagnosis not present

## 2021-02-07 DIAGNOSIS — K5730 Diverticulosis of large intestine without perforation or abscess without bleeding: Secondary | ICD-10-CM | POA: Diagnosis not present

## 2021-02-07 DIAGNOSIS — R3121 Asymptomatic microscopic hematuria: Secondary | ICD-10-CM | POA: Diagnosis not present

## 2021-02-18 DIAGNOSIS — L723 Sebaceous cyst: Secondary | ICD-10-CM | POA: Diagnosis not present

## 2021-02-18 DIAGNOSIS — L720 Epidermal cyst: Secondary | ICD-10-CM | POA: Diagnosis not present

## 2021-02-28 DIAGNOSIS — R350 Frequency of micturition: Secondary | ICD-10-CM | POA: Diagnosis not present

## 2021-02-28 DIAGNOSIS — R3121 Asymptomatic microscopic hematuria: Secondary | ICD-10-CM | POA: Diagnosis not present

## 2021-05-06 ENCOUNTER — Ambulatory Visit (INDEPENDENT_AMBULATORY_CARE_PROVIDER_SITE_OTHER): Payer: BC Managed Care – PPO | Admitting: Family

## 2021-05-06 ENCOUNTER — Ambulatory Visit: Payer: Self-pay

## 2021-05-06 DIAGNOSIS — G8929 Other chronic pain: Secondary | ICD-10-CM

## 2021-05-06 DIAGNOSIS — M5441 Lumbago with sciatica, right side: Secondary | ICD-10-CM

## 2021-05-06 NOTE — Progress Notes (Signed)
Office Visit Note   Patient: Roberta Stewart           Date of Birth: 1980-08-09           MRN: [id number] Visit Date: 05/06/2021              Requested by: Renford Dills, MD [address] [place],  [place] [address] PCP: Renford Dills, MD  Chief Complaint  Patient presents with   Lower Back - Pain      HPI: The patient is a 40 year old woman who presents today complaining of low back pain.  This has been ongoing since December of last year.  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  Since then her pain has been waxing and waning she did have x-rays done at her chiropractor back in February she has had some adjustments which provided some temporary relief.  She is also done integrative physical therapy which provided minimal relief.  She has pain with prolonged sitting, prolonged standing difficulty getting comfortable in bed especially lying on her side.  She complains of popping and cracking and aching lateral low back pain.    She has gotten a new standing work desk so she can alternate sitting and standing and work on her posture she is then you doing yoga which has provided some very minimal improvement in her pain she feels that stretching helps a little bit she is also gotten a new firmer mattress it attempt to support her back.  She is also added a heel lift into her left shoe at the chiropractor's recommendation which provided a little relief as well  She uses ibuprofen and Tylenol which provides her with moderate relief of her pain  She notes that she has intermittent right great toe shooting pain as well as numbness and tingling in the lateral aspect of her lower leg from the knee to the ankle.  No weakness no red flag symptoms.  Assessment & Plan: Visit Diagnoses:  1.  Chronic bilateral low back pain with right-sided sciatica     Plan: Offered prednisone burst.  Patient declined at this time.  Has failed conservative measures we will proceed with MRI of the lumbar spine  Follow-Up Instructions: No follow-ups on file.   Back Exam   Tenderness  The patient is experiencing no tenderness.   Range of Motion  The patient has normal back ROM.  Muscle Strength  The patient has normal back strength.  Tests  Straight leg raise right: negative Straight leg raise left: negative     Patient is alert, oriented, no adenopathy, well-dressed, normal affect, normal respiratory effort.   Imaging: No results found. No images are attached to the encounter.  Labs: Lab Results  Component Value Date   ESRSEDRATE 6 02/25/2018   CRP 1.4 (H) 02/25/2018   REPTSTATUS 09/06/2019 FINAL 09/04/2019   CULT >=100,000 COLONIES/mL SERRATIA MARCESCENS (A) 09/04/2019   LABORGA SERRATIA MARCESCENS (A) 09/04/2019     Lab Results  Component Value Date   ALBUMIN 4.0 02/25/2018    No results found for: MG No results found for: VD25OH  No results found for: PREALBUMIN CBC EXTENDED Latest Ref Rng & Units 02/25/2018  WBC 4.0 - 10.5 K/uL 9.5  RBC 3.87 - 5.11 MIL/uL  5.36(H)  HGB 12.0 - 15.0 g/dL 13.8  HCT 36.0 - 46.0 % 42.3  PLT 150 - 400 K/uL 250  NEUTROABS 1.7 - 7.7 K/uL 7.7  LYMPHSABS 0.7 - 4.0 K/uL 1.2     There is no height or weight on file to calculate BMI.  Orders:  Orders Placed This Encounter  Procedures   XR Lumbar Spine 2-3 Views   No orders of the defined types were placed in this encounter.    Procedures: No procedures performed  Clinical Data: No additional findings.  ROS:  All other systems negative, except as noted in the HPI. Review of Systems  Objective: Vital Signs: There were no vitals taken for this visit.  Specialty Comments:  No specialty comments available.  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   Past Medical History:  Diagnosis Date   Allergy    Anemia    Angio-edema    Arthritis    Recurrent upper respiratory infection (URI)    Urticaria     Family History  Problem Relation Age of Onset   Hypertension Mother    Thyroid disease Mother    Heart disease Maternal Grandmother    Diabetes Maternal Grandfather    Diabetes Paternal Grandfather     Past Surgical History:  Procedure Laterality Date   FOOT SURGERY     TUBAL LIGATION     Social History   Occupational History   Not on file  Tobacco Use   Smoking status: Never   Smokeless tobacco: Never  Vaping Use   Vaping Use: Never used  Substance and Sexual Activity   Alcohol use: Not Currently    Alcohol/week: 0.0 standard drinks   Drug use: No   Sexual activity: Not on file

## 2021-05-11 DIAGNOSIS — Z20822 Contact with and (suspected) exposure to covid-19: Secondary | ICD-10-CM | POA: Diagnosis not present

## 2021-05-25 ENCOUNTER — Other Ambulatory Visit: Payer: Self-pay

## 2021-05-25 ENCOUNTER — Ambulatory Visit
Admission: RE | Admit: 2021-05-25 | Discharge: 2021-05-25 | Disposition: A | Discharge status: Home / Self Care | Payer: BC Managed Care – PPO | Source: Ambulatory Visit | Attending: Family | Admitting: Family

## 2021-05-25 DIAGNOSIS — G8929 Other chronic pain: Secondary | ICD-10-CM

## 2021-05-25 DIAGNOSIS — M5416 Radiculopathy, lumbar region: Secondary | ICD-10-CM | POA: Diagnosis not present

## 2021-05-25 DIAGNOSIS — M5136 Other intervertebral disc degeneration, lumbar region: Secondary | ICD-10-CM | POA: Diagnosis not present

## 2021-05-25 DIAGNOSIS — M545 Low back pain, unspecified: Secondary | ICD-10-CM | POA: Diagnosis not present

## 2021-05-27 ENCOUNTER — Ambulatory Visit: Payer: BC Managed Care – PPO | Admitting: Orthopedic Surgery

## 2021-05-27 ENCOUNTER — Ambulatory Visit (INDEPENDENT_AMBULATORY_CARE_PROVIDER_SITE_OTHER): Payer: BC Managed Care – PPO | Admitting: Orthopedic Surgery

## 2021-05-27 ENCOUNTER — Encounter: Payer: Self-pay | Admitting: Orthopedic Surgery

## 2021-05-27 ENCOUNTER — Other Ambulatory Visit: Payer: Self-pay

## 2021-05-27 DIAGNOSIS — G8929 Other chronic pain: Secondary | ICD-10-CM | POA: Diagnosis not present

## 2021-05-27 DIAGNOSIS — M5441 Lumbago with sciatica, right side: Secondary | ICD-10-CM

## 2021-05-27 NOTE — Progress Notes (Signed)
Office Visit Note   Patient: Roberta Stewart           Date of Birth: 1981/03/08           MRN: [id number] Visit Date: 05/27/2021              Requested by: Seward Carol, MD [address],  [place] [address] PCP: Seward Carol, MD  Chief Complaint  Patient presents with   Lower Back - Follow-up    MRI review      HPI: Patient is a 40 year old woman who presents in follow-up for lower back pain with right-sided symptoms.  Patient states the pain is worse after prolonged sitting at her daughter's volleyball game as well as prolonged sitting while driving.  Patient states she has tried integrative therapy as well as chiropractic manipulation.  Assessment & Plan: Visit Diagnoses:  1. Chronic bilateral low back pain with right-sided sciatica     Plan: We will set her up for formalized physical therapy upstairs.  Follow-Up Instructions: Return if symptoms worsen or fail to improve.   Ortho Exam  Patient is alert, oriented, no adenopathy, well-dressed, normal affect, normal respiratory effort. Examination patient has a normal gait no focal motor weakness.  Review the MRI scan shows mild degenerative changes of the lumbar spine without significant spinal canal or foraminal stenosis.  Imaging: No results found. No images are attached to the encounter.  Labs: Lab Results  Component Value Date   ESRSEDRATE 6 02/25/2018   CRP 1.4 (H) 02/25/2018   REPTSTATUS 09/06/2019 FINAL 09/04/2019   CULT >=100,000 COLONIES/mL SERRATIA MARCESCENS (A) 09/04/2019   LABORGA SERRATIA MARCESCENS (A) 09/04/2019     Lab Results  Component Value Date   ALBUMIN 4.0 02/25/2018    No results found for: MG No results found for: VD25OH  No results found for: PREALBUMIN CBC EXTENDED Latest Ref Rng & Units 02/25/2018  WBC 4.0 - 10.5 K/uL 9.5  RBC 3.87 - 5.11 MIL/uL 5.36(H)  HGB 12.0 - 15.0 g/dL 13.8  HCT 36.0 - 46.0 % 42.3  PLT 150 - 400 K/uL 250  NEUTROABS 1.7 -  7.7 K/uL 7.7  LYMPHSABS 0.7 - 4.0 K/uL 1.2     There is no height or weight on file to calculate BMI.  Orders:  Orders Placed This Encounter  Procedures   Ambulatory referral to Physical Therapy   No orders of the defined types were placed in this encounter.    Procedures: No procedures performed  Clinical Data: No additional findings.  ROS:  All other systems negative, except as noted in the HPI. Review of Systems  Objective: Vital Signs: There were no vitals taken for this visit.  Specialty Comments:  No specialty comments available.  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   Past Medical History:  Diagnosis Date   Allergy    Anemia    Angio-edema    Arthritis    Recurrent upper respiratory infection (URI)    Urticaria     Family History  Problem Relation Age of Onset   Hypertension Mother    Thyroid disease Mother    Heart disease Maternal Grandmother    Diabetes Maternal Grandfather    Diabetes Paternal Grandfather     Past Surgical  History:  Procedure Laterality Date   FOOT SURGERY     TUBAL LIGATION     Social History   Occupational History   Not on file  Tobacco Use   Smoking status: Never   Smokeless tobacco: Never  Vaping Use   Vaping Use: Never used  Substance and Sexual Activity   Alcohol use: Not Currently    Alcohol/week: 0.0 standard drinks   Drug use: No   Sexual activity: Not on file

## 2021-05-30 ENCOUNTER — Ambulatory Visit
Admission: EM | Admit: 2021-05-30 | Discharge: 2021-05-30 | Disposition: A | Discharge status: Home / Self Care | Payer: BC Managed Care – PPO | Attending: Physician Assistant | Admitting: Physician Assistant

## 2021-05-30 ENCOUNTER — Encounter: Payer: Self-pay | Admitting: Emergency Medicine

## 2021-05-30 DIAGNOSIS — J0100 Acute maxillary sinusitis, unspecified: Secondary | ICD-10-CM | POA: Diagnosis not present

## 2021-05-30 MED ORDER — FLUCONAZOLE 150 MG PO TABS: 150.0000 mg | ORAL_TABLET | Freq: Every day | ORAL | 1 refills | Status: DC

## 2021-05-30 MED ORDER — DOXYCYCLINE HYCLATE 100 MG PO CAPS: 100.0000 mg | ORAL_CAPSULE | Freq: Two times a day (BID) | ORAL | 0 refills | Status: DC

## 2021-05-30 NOTE — ED Triage Notes (Signed)
Hx of sinus infections. Started with cold symptoms that developed into facial pain and ear pressure with green mucus.

## 2021-05-30 NOTE — ED Provider Notes (Signed)
[name]    CSN: [id number] Arrival date & time: 05/30/21  1855      History   Chief Complaint Chief Complaint  Patient presents with   Facial Pain    HPI Roberta Stewart is a 40 y.o. female.   Patient here today for evaluation of right-sided sinus pain and pressure that started today.  She reports that for the last several days she has had cold-like symptoms but now her mucus is turning colors and she has more pressure and pain.  She has not had fever.  She reports some pressure in her right ear.  She has tried Sudafed with mild relief.  The history is provided by the patient.   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      Home Medications    Prior to Admission medications   Medication Sig Start Date End Date Taking? Authorizing Provider  doxycycline (VIBRAMYCIN) 100 MG capsule Take 1 capsule (100 mg total) by mouth 2 (two) times daily. 05/30/21  Yes Tomi Bamberger, PA-C  fluconazole (DIFLUCAN) 150 MG tablet Take 1 tablet (150 mg total) by mouth daily. 05/30/21  Yes Tomi Bamberger, PA-C  azelastine (ASTELIN) 0.1 % nasal spray Place 2 sprays into both nostrils 2 (two) times daily. 02/11/20   Cathie Hoops, Amy V, PA-C  Fluticasone Propionate (XHANCE) 93 MCG/ACT EXHU Place 2 sprays into the nose 2 (two) times daily. 03/29/18   Bobbitt, Heywood Iles, MD  lactobacillus acidophilus & bulgar (LACTINEX) chewable tablet Chew 1 tablet by mouth daily.    [provider]  Multiple Vitamin (MULTIVITAMIN WITH MINERALS) TABS tablet Take 1 tablet by mouth daily.    [provider]    Family History Family History  Problem Relation Age of Onset   Hypertension Mother    Thyroid disease Mother    Heart disease Maternal Grandmother    Diabetes Maternal Grandfather    Diabetes Paternal Grandfather     Social History Social History   Tobacco Use   Smoking status: Never   Smokeless tobacco: Never  Vaping Use   Vaping Use: Never used  Substance Use Topics   Alcohol use: Not Currently    Alcohol/week: 0.0 standard drinks   Drug use: No     Allergies   Keflex [cephalexin] and Darvon [propoxyphene]   Review of Systems Review of Systems  Constitutional:  Negative for chills and fever.  HENT:  Positive for congestion, ear pain, sinus pressure and sore throat (right sided from drainage).   Eyes:  Negative for discharge and redness.  Respiratory:  Positive for cough. Negative for shortness of breath and wheezing.   Gastrointestinal:  Negative for abdominal pain, diarrhea, nausea and vomiting.    Physical Exam Triage Vital Signs ED Triage Vitals  Enc Vitals Group     BP 05/30/21 1904 (!) 142/97  Pulse Rate 05/30/21 1904 66     Resp 05/30/21 1904 16     Temp 05/30/21 1904 98 F (36.7 C)     Temp Source 05/30/21 1904 Oral     SpO2 05/30/21 1904 98 %     Weight --      Height --      Head Circumference --      Peak Flow --      Pain Score 05/30/21 1905 3     Pain Loc --      Pain Edu? --      Excl. in GC? --    No data found.  Updated Vital Signs BP (!) 142/97 (BP Location: Left Arm)   Pulse 66   Temp 98 F (36.7 C) (Oral)   Resp 16   SpO2 98%      Physical Exam Vitals and nursing note reviewed.  Constitutional:      General: She is not in acute distress.    Appearance: Normal appearance. She is not ill-appearing.  HENT:     Head: Normocephalic and atraumatic.     Right Ear: Tympanic membrane normal.     Left  Ear: Tympanic membrane normal.     Nose: Congestion present.     Mouth/Throat:     Mouth: Mucous membranes are moist.     Pharynx: No oropharyngeal exudate or posterior oropharyngeal erythema.  Eyes:     Conjunctiva/sclera: Conjunctivae normal.  Cardiovascular:     Rate and Rhythm: Normal rate and regular rhythm.     Heart sounds: Normal heart sounds. No murmur heard. Pulmonary:     Effort: Pulmonary effort is normal. No respiratory distress.     Breath sounds: Normal breath sounds. No wheezing, rhonchi or rales.  Skin:    General: Skin is warm and dry.  Neurological:     Mental Status: She is alert.  Psychiatric:        Mood and Affect: Mood normal.        Thought Content: Thought content normal.     UC Treatments / Results  Labs (all labs ordered are listed, but only abnormal results are displayed) Labs Reviewed - No data to display  EKG   Radiology No results found.  Procedures Procedures (including critical care time)  Medications Ordered in UC Medications - No data to display  Initial Impression / Assessment and Plan / UC Course  I have reviewed the triage vital signs and the nursing notes.  Pertinent labs & imaging results that were available during my care of the patient were reviewed by me and considered in my medical decision making (see chart for details).  Doxycycline prescribed for suspected sinusitis.  Patient request Diflucan given typical yeast infections following antibiotic therapy.  She request 2 tablets 1 for the beginning of treatment and 1 for the end as this is typically the regimen she requires.  Medication prescription sent to pharmacy as requested.  Urged follow-up if symptoms fail to improve or worsen.  Final Clinical Impressions(s) / UC Diagnoses   Final diagnoses:  Acute maxillary sinusitis, recurrence not specified   Discharge Instructions   None    ED Prescriptions     Medication Sig Dispense Auth. Provider   doxycycline  (VIBRAMYCIN) 100 MG capsule Take 1 capsule (100 mg total) by mouth 2 (two) times daily. 20 capsule Erma Pinto F, PA-C   fluconazole (DIFLUCAN) 150 MG tablet Take 1 tablet (150 mg total) by mouth daily. 1 tablet Erma Pinto  F, PA-C      PDMP not reviewed this encounter.   Tomi Bamberger, PA-C 05/30/21 1923

## 2021-06-05 ENCOUNTER — Ambulatory Visit: Payer: BC Managed Care – PPO | Admitting: Physical Therapy

## 2021-06-20 ENCOUNTER — Encounter: Payer: Self-pay | Admitting: Rehabilitative and Restorative Service Providers"

## 2021-06-20 ENCOUNTER — Ambulatory Visit (INDEPENDENT_AMBULATORY_CARE_PROVIDER_SITE_OTHER): Payer: BC Managed Care – PPO | Admitting: Rehabilitative and Restorative Service Providers"

## 2021-06-20 ENCOUNTER — Other Ambulatory Visit: Payer: Self-pay

## 2021-06-20 DIAGNOSIS — G8929 Other chronic pain: Secondary | ICD-10-CM | POA: Diagnosis not present

## 2021-06-20 DIAGNOSIS — M545 Low back pain, unspecified: Secondary | ICD-10-CM | POA: Diagnosis not present

## 2021-06-20 DIAGNOSIS — M6281 Muscle weakness (generalized): Secondary | ICD-10-CM

## 2021-06-20 NOTE — Patient Instructions (Signed)
Access Code: [email or web address] URL: [email or web address] Date: 06/20/2021 Prepared by: Chyrel Masson  Exercises Supine Lower Trunk Rotation - 2 x daily - 7 x weekly - 1 sets - 5 reps - 15 hold Supine Figure 4 Piriformis Stretch - 2 x daily - 7 x weekly - 1 sets - 5 reps - 30 hold Supine Bridge - 2 x daily - 7 x weekly - 3 sets - 10 reps - 2 hold Supine Figure 4 Piriformis Stretch - 2 x daily - 7 x weekly - 1 sets - 5 reps - 30 hold Sidelying Hip Abduction - 2 x daily - 7 x weekly - 3 sets - 10 reps Standing Lumbar Extension - 2 x daily - 7 x weekly - 1 sets - 5 reps Plank on Knees - 2 x daily - 7 x weekly - 1 sets - 5 reps - to fatigue hold

## 2021-06-20 NOTE — Therapy (Signed)
[facility] Physical Therapy [address] [place], [place], [address] Phone: [phone]   Fax:  [phone]  Physical Therapy Evaluation  Patient Details  Name: Roberta Stewart MRN: [id number] Date of Birth: 21-Aug-1980 Referring Provider (PT): Nadara Mustard, MD   Encounter Date: 06/20/2021   PT End of Session - 06/20/21 0930     Visit Number 1    Number of Visits 20    Date for PT Re-Evaluation 08/29/21    Authorization Type [employer] 20% insurance - 72 remaining PT/OT/ST visits for calendar year    PT Start Time 0935    PT Stop Time 1015    PT Time Calculation (min) 40 min    Activity Tolerance Patient tolerated treatment well    Behavior During Therapy [facility] for tasks assessed/performed             Past Medical History:  Diagnosis Date   Allergy    Anemia    Angio-edema    Arthritis    Recurrent upper respiratory infection (URI)    Urticaria     Past Surgical History:  Procedure Laterality Date   FOOT SURGERY     TUBAL LIGATION      There were no vitals filed for this visit.    Subjective Assessment - 06/20/21 0929     Subjective Pt. came to clinic c complaints of low back pain.  Onset Dec 2021 after helping move family member after a fall she had.  Pt. indicated complaints progressively got worse over time especially noted after lifting.  [facility]. Pt. has had treatment at chiro with relief at times over several months.  Pt. indicated she has increased exercise routine that has helped but feels like core is weak when trying to perform some. Pt. indicated several providers have mentioned building core strength and she indicated unsure how to progress that.    Limitations Sitting;Standing;House hold activities    Diagnostic tests MRI impression from 05/2021 : mild degenerative changes.  xrays 05/2021: minimal spondylosis    Patient Stated Goals Reduce pain, improve core  strnegth for exercise    Currently in Pain? Yes    Pain Score 3    in last few weeks at worst   Pain Location Back    Pain Orientation Lower    Pain Descriptors / Indicators Tightness;Aching;Burning;Sharp    Pain Type Chronic pain    Pain Onset More than a month ago    Pain Frequency Intermittent    Aggravating Factors  prolonged sitting (car rides) stiffness, standing, lying in bed    Pain Relieving Factors stretching helped, general movement at times, chiro visits helped in past, heat                [facility] PT Assessment - 06/20/21 0001       Assessment   Medical Diagnosis M54.41,G89.29 (ICD-10-CM) - Chronic bilateral low back pain with right-sided sciatica    Referring Provider (PT) Nadara Mustard, MD    Onset Date/Surgical Date 06/05/20    Hand Dominance Right      Precautions   Precautions None      Restrictions   Weight Bearing Restrictions No      Balance Screen   Has the patient fallen in the past 6 months No    Has the patient had a decrease in activity level because of a fear of falling?  No    Is the patient reluctant to  leave their home because of a fear of falling?  No      Home Tourist information centre manager residence    Additional Comments bedroom on second story      Prior Function   Level of Independence Independent    Vocation Requirements sitting prolonged (has sit to stand desk)    Leisure Exercise routine      Cognition   Overall Cognitive Status Within Functional Limits for tasks assessed      Observation/Other Assessments   Observations heel lift in Lt shoe (< .25 inch) upon arrival.    Focus on Therapeutic Outcomes (FOTO)  intake 65%, predicted 72%      Functional Tests   Functional tests Sit to Stand      Sit to Stand   Comments 18 inch chair s UE assist with no complaints/deviations      ROM / Strength   AROM / PROM / Strength AROM;PROM;Strength      AROM   AROM Assessment Site Lumbar;Hip    Right/Left Hip Left;Right     Right Hip External Rotation  35    Right Hip Internal Rotation  20    Left Hip External Rotation  26    Left Hip Internal Rotation  20    Lumbar Flexion movement to ankles, no complaints    Lumbar Extension 50% in standing with ERP lumbar centrally.  repeated x 5 improved symptoms, improved movement to 75%    Lumbar - Right Side Bend movement to Rt fibular head, no complaints    Lumbar - Left Side Bend movement to Lt fibular head, no complaints      Strength   Overall Strength Comments supine lumbar flexion MMT: 3+/5    Strength Assessment Site Hip;Knee    Right/Left Hip Left;Right    Right Hip Flexion 5/5    Right Hip ABduction 5/5    Left Hip Flexion 5/5    Left Hip ABduction 3+/5    Right/Left Knee Left;Right    Right Knee Flexion 5/5    Right Knee Extension 5/5    Left Knee Flexion 5/5    Left Knee Extension 5/5      Palpation   Palpation comment No specific tenderness to touch observed      Special Tests   Other special tests (-) painful arc in lumbar flexion, (-) slump bilateral, (-) crossed SLR                        Objective measurements completed on examination: See above findings.       [facility] Adult PT Treatment/Exercise - 06/20/21 0001       Exercises   Exercises Other Exercises    Other Exercises  HEP instruction/performance c cues for techniques, handout provided.  Trial set performed of each for comprehension and symptom assessment.  HEP consisting of supine bridge, supine LTR stretch, supine piriformis push away figure , pull to figure 4, sidelying hip abduction SLR, prone plank on knees, standing lumbar extension (hands at hips)                     PT Education - 06/20/21 0930     Education Details HEP, POC    Person(s) Educated Patient    Methods Explanation;Demonstration;Verbal cues;Handout    Comprehension Verbalized understanding;Returned demonstration              PT Short Term Goals - 06/20/21 5885  PT  SHORT TERM GOAL #1   Title Patient will demonstrate independent use of home exercise program to maintain progress from in clinic treatments.    Time 3    Period Weeks    Status New    Target Date 07/11/21               PT Long Term Goals - 06/20/21 0931       PT LONG TERM GOAL #1   Title Patient will demonstrate/report pain at worst less than or equal to 2/10 to facilitate minimal limitation in daily activity secondary to pain symptoms.    Time 10    Period Weeks    Status New    Target Date 08/29/21      PT LONG TERM GOAL #2   Title Patient will demonstrate independent use of home exercise program to facilitate ability to maintain/progress functional gains from skilled physical therapy services.    Time 10    Period Weeks    Status New    Target Date 08/29/21      PT LONG TERM GOAL #3   Title Pt. will demonstrate FOTO outcome > or = 72 % to indicated reduced disability due to condition.    Time 10    Period Weeks    Status New    Target Date 08/29/21      PT LONG TERM GOAL #4   Title Patient will demonstrate lumbar extension 100 % WFL s symptoms to facilitate upright standing, walking posture at PLOF s limitation.    Time 10    Period Weeks    Status New    Target Date 08/29/21      PT LONG TERM GOAL #5   Title Pt. will demonstrate hip strength 5/5 throughout bilateral to facilitate usual exercise routine at PLOF s limitation.    Time 10    Period Weeks    Status New    Target Date 08/29/21      Additional Long Term Goals   Additional Long Term Goals Yes      PT LONG TERM GOAL #6   Title Pt. will demonstrate Lt hip AROM equal to Rt to facilitate usual mobility at PLOF.    Time 10    Period Weeks    Status New    Target Date 08/29/21                    Plan - 06/20/21 0933     Clinical Impression Statement Patient is a 40 y.o. who comes to clinic with complaints of low back pain with mild mobility, strength and movement coordination deficits  that impair their ability to perform usual daily and recreational functional activities without increase difficulty/symptoms at this time.  Patient to benefit from skilled PT services to address impairments and limitations to improve to previous level of function without restriction secondary to condition.    Examination-Activity Limitations Sit;Sleep;Stand;Lift;Carry;Bend    Examination-Participation Restrictions Community Activity;Other;Occupation   exercise   Stability/Clinical Decision Making Stable/Uncomplicated    Clinical Decision Making Low    Rehab Potential Good    PT Frequency Other (comment)   1-2x/week   PT Duration Other (comment)   10 weeks   PT Treatment/Interventions ADLs/Self Care Home Management;Cryotherapy;Electrical Stimulation;Iontophoresis 4mg /ml Dexamethasone;Moist Heat;Traction;Balance training;Therapeutic exercise;Therapeutic activities;Functional mobility training;Stair training;Gait training;DME Instruction;Ultrasound;Neuromuscular re-education;Patient/family education;Passive range of motion;Spinal Manipulations;Joint Manipulations;Dry needling;Taping;Manual techniques    PT Next Visit Plan review HEP, progressive core strengthening.    PT Home Exercise  Plan [email or web address]    Consulted and Agree with Plan of Care Patient             Patient will benefit from skilled therapeutic intervention in order to improve the following deficits and impairments:  Decreased endurance, Hypomobility, Pain, Decreased strength, Decreased activity tolerance, Decreased mobility, Difficulty walking, Decreased range of motion, Impaired perceived functional ability, Improper body mechanics, Postural dysfunction, Impaired flexibility, Decreased coordination  Visit Diagnosis: Chronic midline low back pain without sciatica  Muscle weakness (generalized)     Problem List Patient Active Problem List   Diagnosis Date Noted   Allergic rhinitis with a nonallergic component 03/29/2018   Drug  allergy 03/29/2018   Insect sting 03/29/2018   Pain in right foot 12/09/2016   Achilles tendon contracture, right 12/09/2016    Chyrel Masson, PT, DPT, OCS, ATC 06/20/21  10:50 AM    [facility] Physical Therapy [address] [place], [place], [address] Phone: [phone]   Fax:  [phone]  Name: Roberta Stewart MRN: [id number] Date of Birth: 06-18-1981

## 2021-06-26 ENCOUNTER — Encounter: Payer: BC Managed Care – PPO | Admitting: Rehabilitative and Restorative Service Providers"

## 2021-07-03 ENCOUNTER — Ambulatory Visit: Payer: BC Managed Care – PPO | Admitting: Rehabilitative and Restorative Service Providers"

## 2021-07-03 ENCOUNTER — Encounter: Payer: Self-pay | Admitting: Rehabilitative and Restorative Service Providers"

## 2021-07-03 ENCOUNTER — Other Ambulatory Visit: Payer: Self-pay

## 2021-07-03 DIAGNOSIS — G8929 Other chronic pain: Secondary | ICD-10-CM

## 2021-07-03 DIAGNOSIS — M545 Low back pain, unspecified: Secondary | ICD-10-CM

## 2021-07-03 DIAGNOSIS — M6281 Muscle weakness (generalized): Secondary | ICD-10-CM

## 2021-07-03 NOTE — Patient Instructions (Signed)
Access Code: [email or web address] URL: [email or web address] Date: 07/03/2021 Prepared by: Pauletta Browns  Exercises Supine Lower Trunk Rotation - 2 x daily - 7 x weekly - 1 sets - 4-5 reps - 15 hold Supine Figure 4 Piriformis Stretch - 2 x daily - 7 x weekly - 1 sets - 4-5 reps - 30 hold Supine Bridge - 2 x daily - 7 x weekly - 3 sets - 10 reps - 2-10 hold Supine Figure 4 Piriformis Stretch - 2 x daily - 7 x weekly - 1 sets - 5 reps - 30 hold Sidelying Hip Abduction - 2 x daily - 7 x weekly - 3 sets - 10 reps Standing Lumbar Extension - 2 x daily - 7 x weekly - 1 sets - 5 reps Plank on Knees - 2 x daily - 7 x weekly - 1 sets - 5 reps - to fatigue hold Standing Lumbar Extension at Wall - Forearms - 5 x daily - 7 x weekly - 1 sets - 5 reps - 3 seconds hold Prone Alternating Arm and Leg Lifts - 1 x daily - 7 x weekly - 2 sets - 10 reps - 3-10 seconds hold Full Superman on Table - 1 x daily - 7 x weekly - 1 sets - 5-10 reps - 5-30 seconds hold

## 2021-07-03 NOTE — Therapy (Signed)
[facility] Physical Therapy [address] [place], [place], [address] Phone: [phone]   Fax:  [phone]  Physical Therapy Treatment  Patient Details  Name: Roberta Stewart MRN: [id number] Date of Birth: 04-02-1981 Referring Provider (PT): Nadara Mustard, MD   Encounter Date: 07/03/2021   PT End of Session - 07/03/21 0853     Visit Number 2    Number of Visits 20    Date for PT Re-Evaluation 08/29/21    Authorization Type [employer] 20% insurance - 72 remaining PT/OT/ST visits for calendar year    PT Start Time 0809    PT Stop Time 0847    PT Time Calculation (min) 38 min    Activity Tolerance Patient tolerated treatment well;No increased pain;Patient limited by fatigue    Behavior During Therapy [facility] for tasks assessed/performed             Past Medical History:  Diagnosis Date   Allergy    Anemia    Angio-edema    Arthritis    Recurrent upper respiratory infection (URI)    Urticaria     Past Surgical History:  Procedure Laterality Date   FOOT SURGERY     TUBAL LIGATION      There were no vitals filed for this visit.   Subjective Assessment - 07/03/21 0820     Subjective Kadee notes more flexibility in her L hip.  She also didn't feel as sore after her workout.  She does have some questions with her HEP.    Limitations Sitting;Standing;House hold activities    How long can you sit comfortably? 60 minutes    How long can you stand comfortably? Unlimited    How long can you walk comfortably? 90 minutes    Diagnostic tests MRI impression from 05/2021 : mild degenerative changes.  xrays 05/2021: minimal spondylosis    Patient Stated Goals Reduce pain, improve core strnegth for exercise    Currently in Pain? No/denies    Pain Score 0-No pain    Pain Location Back    Pain Orientation Lower    Pain Type Chronic pain    Pain Onset More than a month ago    Pain Frequency Intermittent    Aggravating Factors  Prolonged postures, bending and  twisting    Pain Relieving Factors Change of position, walking, PT exercises    Multiple Pain Sites No                               [facility] Adult PT Treatment/Exercise - 07/03/21 0001       Therapeutic Activites    Therapeutic Activities ADL's;Lifting;Work Counselling psychologist;Other Therapeutic Activities    ADL's Golfer's and diagonal lifts    Lifting Frequent change of position    Work Simulation Review of standing desk    Other Therapeutic Activities Review spine anatomy with model and review of imaging for education purposes      Exercises   Exercises Lumbar      Lumbar Exercises: Stretches   Figure 4 Stretch 5 reps;30 seconds    Figure 4 Stretch Limitations And bring knee to chest    Other Lumbar Stretch Exercise Pelvic rotation (knees side to side) 5X 15 seconds      Lumbar Exercises: Standing   Other Standing Lumbar Exercises Standing lumbar/trunk extension (hips forward) 10X 3 seconds 2 sets      Lumbar Exercises: Supine   Bridge Non-compliant;10 reps;5 seconds;Limitations  Bridge Limitations 2 sets      Lumbar Exercises: Sidelying   Hip Abduction Both;10 reps;3 seconds    Hip Abduction Limitations 1/4 turn to stomach      Lumbar Exercises: Prone   Opposite Arm/Leg Raise Left arm/Right leg;Right arm/Left leg;10 reps;3 seconds    Opposite Arm/Leg Raise Limitations Toes back and palms in, look straight down    Other Prone Lumbar Exercises Superman (B arm and leg extensions) 5X 5 seconds    Other Prone Lumbar Exercises Plank 20 seconds                     PT Education - 07/03/21 3532     Education Details Reviewed HEP with correction as needed.  Emphasized lumbar extension with standing lumbar extension and progressed prone spine strengthening.    Person(s) Educated Patient    Methods Explanation;Verbal cues;Tactile cues;Handout    Comprehension Verbalized understanding;Tactile cues required;Need further instruction;Returned  demonstration;Verbal cues required              PT Short Term Goals - 06/20/21 0931       PT SHORT TERM GOAL #1   Title Patient will demonstrate independent use of home exercise program to maintain progress from in clinic treatments.    Time 3    Period Weeks    Status New    Target Date 07/11/21               PT Long Term Goals - 06/20/21 0931       PT LONG TERM GOAL #1   Title Patient will demonstrate/report pain at worst less than or equal to 2/10 to facilitate minimal limitation in daily activity secondary to pain symptoms.    Time 10    Period Weeks    Status New    Target Date 08/29/21      PT LONG TERM GOAL #2   Title Patient will demonstrate independent use of home exercise program to facilitate ability to maintain/progress functional gains from skilled physical therapy services.    Time 10    Period Weeks    Status New    Target Date 08/29/21      PT LONG TERM GOAL #3   Title Pt. will demonstrate FOTO outcome > or = 72 % to indicated reduced disability due to condition.    Time 10    Period Weeks    Status New    Target Date 08/29/21      PT LONG TERM GOAL #4   Title Patient will demonstrate lumbar extension 100 % WFL s symptoms to facilitate upright standing, walking posture at PLOF s limitation.    Time 10    Period Weeks    Status New    Target Date 08/29/21      PT LONG TERM GOAL #5   Title Pt. will demonstrate hip strength 5/5 throughout bilateral to facilitate usual exercise routine at PLOF s limitation.    Time 10    Period Weeks    Status New    Target Date 08/29/21      Additional Long Term Goals   Additional Long Term Goals Yes      PT LONG TERM GOAL #6   Title Pt. will demonstrate Lt hip AROM equal to Rt to facilitate usual mobility at PLOF.    Time 10    Period Weeks    Status New    Target Date 08/29/21  Plan - 07/03/21 0854     Clinical Impression Statement Odalys is already noting progress  from her 1 PT visit.  She did require correction with her HEP and progressions were made to spine strength.  We also spent quite a bit of time on education with the idea of improving postural awareness and body mechanics to reduce the risk of reinjury in the future.  Continue POC with emphasis on core strength and practical mechanics.    Examination-Activity Limitations Sit;Sleep;Stand;Lift;Carry;Bend    Examination-Participation Restrictions Community Activity;Other;Occupation   exercise   Stability/Clinical Decision Making Stable/Uncomplicated    Rehab Potential Good    PT Frequency Other (comment)   1-2x/week   PT Duration Other (comment)   10 weeks   PT Treatment/Interventions ADLs/Self Care Home Management;Cryotherapy;Electrical Stimulation;Iontophoresis 4mg /ml Dexamethasone;Moist Heat;Traction;Balance training;Therapeutic exercise;Therapeutic activities;Functional mobility training;Stair training;Gait training;DME Instruction;Ultrasound;Neuromuscular re-education;Patient/family education;Passive range of motion;Spinal Manipulations;Joint Manipulations;Dry needling;Taping;Manual techniques    PT Next Visit Plan Continue strength progressions and practical body mechanics    PT Home Exercise Plan    Consulted and Agree with Plan of Care Patient             Patient will benefit from skilled therapeutic intervention in order to improve the following deficits and impairments:  Decreased endurance, Hypomobility, Pain, Decreased strength, Decreased activity tolerance, Decreased mobility, Difficulty walking, Decreased range of motion, Impaired perceived functional ability, Improper body mechanics, Postural dysfunction, Impaired flexibility, Decreased coordination  Visit Diagnosis: Chronic midline low back pain without sciatica  Muscle weakness (generalized)     Problem List Patient Active Problem List   Diagnosis Date Noted   Allergic rhinitis with a nonallergic component  03/29/2018   Drug allergy 03/29/2018   Insect sting 03/29/2018   Pain in right foot 12/09/2016   Achilles tendon contracture, 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 [place], [place], [place] Phone: [phone]   Fax:  [phone]  Name: CHRYS LANDGREBE MRN: Honor Junes Date of Birth: [birth year]

## 2021-07-11 ENCOUNTER — Encounter: Payer: Self-pay | Admitting: Rehabilitative and Restorative Service Providers"

## 2021-07-11 ENCOUNTER — Other Ambulatory Visit: Payer: Self-pay

## 2021-07-11 ENCOUNTER — Ambulatory Visit: Payer: BC Managed Care – PPO | Admitting: Rehabilitative and Restorative Service Providers"

## 2021-07-11 DIAGNOSIS — M6281 Muscle weakness (generalized): Secondary | ICD-10-CM

## 2021-07-11 DIAGNOSIS — M545 Low back pain, unspecified: Secondary | ICD-10-CM | POA: Diagnosis not present

## 2021-07-11 DIAGNOSIS — G8929 Other chronic pain: Secondary | ICD-10-CM

## 2021-07-11 NOTE — Therapy (Addendum)
[facility] [address] [place], [place], [address] Phone: [phone]   Fax:  [phone]  Physical Therapy Treatment  Patient Details  Name: Roberta Stewart MRN: [id number] Date of Birth: 1980-11-30 Referring Provider (PT): Nadara Mustarduda, Marcus V, MD   Encounter Date: 07/11/2021   PT End of Session - 07/11/21 0905     Visit Number 3    Number of Visits 20    Date for PT Re-Evaluation 08/29/21    Authorization Type [employer] 20% insurance - 72 remaining PT/OT/ST visits for calendar year    PT Start Time 0855    PT Stop Time 0927    PT Time Calculation (min) 32 min    Activity Tolerance Patient tolerated treatment well    [facility] tasks assessed/performed             Past Medical History:  Diagnosis Date   Allergy    Anemia    Angio-edema    Arthritis    Recurrent upper respiratory infection (URI)    Urticaria     Past Surgical History:  Procedure Laterality Date   FOOT SURGERY     TUBAL LIGATION      There were no vitals filed for this visit.   Subjective Assessment - 07/11/21 0855     Subjective Pt. indicated ache in back varying from 2/10 today up to 5/10 at worst.  Pt. indicated sitting and standing is ok.  Stiffness with changing positioning.    Limitations Sitting;Standing;House hold activities    Diagnostic tests MRI impression from 05/2021 : mild degenerative changes.  xrays 05/2021: minimal spondylosis    Patient Stated Goals Reduce pain, improve core strnegth for exercise    Currently in Pain? Yes    Pain Score 2     Pain Location Back    Pain Orientation Lower    Pain Descriptors / Indicators Tightness;Aching    Pain Type Chronic pain    Pain Onset More than a month ago    Pain Frequency Intermittent                [facility] PT Assessment - 07/11/21 0001       Assessment   Medical Diagnosis M54.41,G89.29 (ICD-10-CM) - Chronic bilateral low back pain with right-sided sciatica     Referring Provider (PT) Nadara Mustarduda, Marcus V, MD    Onset Date/Surgical Date 06/05/20    Hand Dominance Right      Observation/Other Assessments   Focus on Therapeutic Outcomes (FOTO)  updated 77%      AROM   Right Hip External Rotation  40    Right Hip Internal Rotation  30    Left Hip External Rotation  32    Left Hip Internal Rotation  38    Lumbar Extension 75% ERP in lumbar                           [facility] Adult PT Treatment/Exercise - 07/11/21 0001       Lumbar Exercises: Stretches   Single Knee to Chest Stretch 3 reps;Left;Right   bilateral   Lower Trunk Rotation 3 reps   15 seconds x 3 bilateral   Other Lumbar Stretch Exercise Verbal review of figure 4 push away, pull toward stretching in HEP      Lumbar Exercises: Supine   Bridge 15 reps;3 seconds      Lumbar Exercises: Prone   Opposite Arm/Leg Raise Left arm/Right leg;Right  arm/Left leg;10 reps    Other Prone Lumbar Exercises Held superman due to difficulty/achy feel    Other Prone Lumbar Exercises plank on knees to fatigue x 3 (30-45 seconds )                       PT Short Term Goals - 07/11/21 0906       PT SHORT TERM GOAL #1   Title Patient will demonstrate independent use of home exercise program to maintain progress from in clinic treatments.    Time 3    Period Weeks    Status Achieved    Target Date 07/11/21               PT Long Term Goals - 07/11/21 0926       PT LONG TERM GOAL #1   Title Patient will demonstrate/report pain at worst less than or equal to 2/10 to facilitate minimal limitation in daily activity secondary to pain symptoms.    Time 10    Period Weeks    Status On-going    Target Date 08/29/21      PT LONG TERM GOAL #2   Title Patient will demonstrate independent use of home exercise program to facilitate ability to maintain/progress functional gains from skilled physical therapy services.    Time 10    Period Weeks    Status On-going    Target  Date 08/29/21      PT LONG TERM GOAL #3   Title Pt. will demonstrate FOTO outcome > or = 72 % to indicated reduced disability due to condition.    Time 10    Period Weeks    Status Achieved    Target Date 08/29/21      PT LONG TERM GOAL #4   Title Patient will demonstrate lumbar extension 100 % WFL s symptoms to facilitate upright standing, walking posture at PLOF s limitation.    Time 10    Period Weeks    Status On-going    Target Date 08/29/21      PT LONG TERM GOAL #5   Title Pt. will demonstrate hip strength 5/5 throughout bilateral to facilitate usual exercise routine at PLOF s limitation.    Time 10    Period Weeks    Status On-going    Target Date 08/29/21      PT LONG TERM GOAL #6   Title Pt. will demonstrate Lt hip AROM equal to Rt to facilitate usual mobility at PLOF.    Time 10    Period Weeks    Status On-going    Target Date 08/29/21                   Plan - 07/11/21 0906     Clinical Impression Statement Pt. indicated low ache present in lumbar that was persistent.  Reviewed intervention c focus on stretching movements for symptom relief.  Adjusted to avoid superman at this time due to pain complaints noted due to difficulty, instead focusing on alt arm/leg lift.  Education provided to encourage use of mobility based intervention for symptom relief as needed with core stability strength training in times where symptoms are better.   Hip mobiity measured better today than evaluation.   Encouraged plan of increased overall leg strength to offload lumbar region.    Examination-Activity Limitations Sit;Sleep;Stand;Lift;Carry;Bend    Examination-Participation Restrictions Community Activity;Other;Occupation   exercise   Stability/Clinical Decision Making Stable/Uncomplicated    Rehab Potential  Good    PT Frequency Other (comment)   1-2x/week   PT Duration Other (comment)   10 weeks   PT Treatment/Interventions ADLs/Self Care Home  Management;Cryotherapy;Electrical Stimulation;Iontophoresis 4mg /ml Dexamethasone;Moist Heat;Traction;Balance training;Therapeutic exercise;Therapeutic activities;Functional mobility training;Stair training;Gait training;DME Instruction;Ultrasound;Neuromuscular re-education;Patient/family education;Passive range of motion;Spinal Manipulations;Joint Manipulations;Dry needling;Taping;Manual techniques    PT Next Visit Plan Recheck effects of HEP on symptom relief, progress strengthening plan for eventual d/c when symptoms allow.    PT Home Exercise Plan    Consulted and Agree with Plan of Care Patient             Patient will benefit from skilled therapeutic intervention in order to improve the following deficits and impairments:  Decreased endurance, Hypomobility, Pain, Decreased strength, Decreased activity tolerance, Decreased mobility, Difficulty walking, Decreased range of motion, Impaired perceived functional ability, Improper body mechanics, Postural dysfunction, Impaired flexibility, Decreased coordination  Visit Diagnosis: Chronic midline low back pain without sciatica  Muscle weakness (generalized)     Problem List Patient Active Problem List   Diagnosis Date Noted   Allergic rhinitis with a nonallergic component 03/29/2018   Drug allergy 03/29/2018   Insect sting 03/29/2018   Pain in right foot 12/09/2016   Achilles tendon contracture, right 12/09/2016    02/08/2017, PT, DPT, OCS, ATC 07/11/21  9:28 AM    [facility] [address] [place], [place], [place] Phone: [phone]   Fax:  [phone]  Name: Roberta Stewart MRN: Roberta Junes Date of Birth: 07-Aug-1980

## 2021-07-14 ENCOUNTER — Ambulatory Visit (INDEPENDENT_AMBULATORY_CARE_PROVIDER_SITE_OTHER): Payer: BC Managed Care – PPO | Admitting: Rehabilitative and Restorative Service Providers"

## 2021-07-14 ENCOUNTER — Other Ambulatory Visit: Payer: Self-pay

## 2021-07-14 ENCOUNTER — Encounter: Payer: Self-pay | Admitting: Rehabilitative and Restorative Service Providers"

## 2021-07-14 DIAGNOSIS — M6281 Muscle weakness (generalized): Secondary | ICD-10-CM

## 2021-07-14 DIAGNOSIS — G8929 Other chronic pain: Secondary | ICD-10-CM

## 2021-07-14 DIAGNOSIS — M545 Low back pain, unspecified: Secondary | ICD-10-CM

## 2021-07-14 NOTE — Therapy (Signed)
[facility] [address] [place], [place], [address] Phone: [phone]   Fax:  [phone]  Physical Therapy Treatment  Patient Details  Name: Roberta Stewart MRN: [id number] Date of Birth: 1981/01/14 Referring Provider (PT): Nadara Mustarduda, Marcus V, MD   Encounter Date: 07/14/2021   PT End of Session - 07/14/21 0812     Visit Number 4    Number of Visits 20    Date for PT Re-Evaluation 08/29/21    Authorization Type [employer] 20% insurance - 72 remaining PT/OT/ST visits for calendar year    PT Start Time 0802    PT Stop Time 0840    PT Time Calculation (min) 38 min    Activity Tolerance Patient tolerated treatment well    Behavior During Therapy [facility] for tasks assessed/performed             Past Medical History:  Diagnosis Date   Allergy    Anemia    Angio-edema    Arthritis    Recurrent upper respiratory infection (URI)    Urticaria     Past Surgical History:  Procedure Laterality Date   FOOT SURGERY     TUBAL LIGATION      There were no vitals filed for this visit.   Subjective Assessment - 07/14/21 0811     Subjective Pt. reported no pain at all today.  Pt. indicated using the stretching more was helpful.    Limitations Sitting;Standing;House hold activities    Diagnostic tests MRI impression from 05/2021 : mild degenerative changes.  xrays 05/2021: minimal spondylosis    Patient Stated Goals Reduce pain, improve core strnegth for exercise    Currently in Pain? No/denies    Pain Onset More than a month ago                               [facility] - 07/14/21 0001       Lumbar Exercises: Aerobic   Recumbent Bike Lvl 3 10 mins (education given for home use about intensity for 50-60% max HR for longer durations)      Lumbar Exercises: Machines for Strengthening   Leg Press Double leg 62 lbs 2 x 15, single leg 31 lbs 2 x 10 each    Other Lumbar Machine Exercise machine row, lat pull  down, chest press 2 x 15 20 lbs each                       PT Short Term Goals - 07/11/21 0906       PT SHORT TERM GOAL #1   Title Patient will demonstrate independent use of home exercise program to maintain progress from in clinic treatments.    Time 3    Period Weeks    Status Achieved    Target Date 07/11/21               PT Long Term Goals - 07/11/21 0926       PT LONG TERM GOAL #1   Title Patient will demonstrate/report pain at worst less than or equal to 2/10 to facilitate minimal limitation in daily activity secondary to pain symptoms.    Time 10    Period Weeks    Status On-going    Target Date 08/29/21      PT LONG TERM GOAL #2   Title Patient will demonstrate independent use of home exercise program to facilitate  ability to maintain/progress functional gains from skilled physical therapy services.    Time 10    Period Weeks    Status On-going    Target Date 08/29/21      PT LONG TERM GOAL #3   Title Pt. will demonstrate FOTO outcome > or = 72 % to indicated reduced disability due to condition.    Time 10    Period Weeks    Status Achieved    Target Date 08/29/21      PT LONG TERM GOAL #4   Title Patient will demonstrate lumbar extension 100 % WFL s symptoms to facilitate upright standing, walking posture at PLOF s limitation.    Time 10    Period Weeks    Status On-going    Target Date 08/29/21      PT LONG TERM GOAL #5   Title Pt. will demonstrate hip strength 5/5 throughout bilateral to facilitate usual exercise routine at PLOF s limitation.    Time 10    Period Weeks    Status On-going    Target Date 08/29/21      PT LONG TERM GOAL #6   Title Pt. will demonstrate Lt hip AROM equal to Rt to facilitate usual mobility at PLOF.    Time 10    Period Weeks    Status On-going    Target Date 08/29/21                   Plan - 07/14/21 5035     Clinical Impression Statement Pt. reported benfit from increased routine use  of stretching for symptoms.  Discussion today on long term training program with incorporation of return to gym based activity c cues on adjusting resistance and building back to previous level steadily.  Performance of select desired exercises for gym use in future with cues for positioning.    Examination-Activity Limitations Sit;Sleep;Stand;Lift;Carry;Bend    Examination-Participation Restrictions Community Activity;Other;Occupation   exercise   Stability/Clinical Decision Making Stable/Uncomplicated    Rehab Potential Good    PT Frequency Other (comment)   1-2x/week   PT Duration Other (comment)   10 weeks   PT Treatment/Interventions ADLs/Self Care Home Management;Cryotherapy;Electrical Stimulation;Iontophoresis 4mg /ml Dexamethasone;Moist Heat;Traction;Balance training;Therapeutic exercise;Therapeutic activities;Functional mobility training;Stair training;Gait training;DME Instruction;Ultrasound;Neuromuscular re-education;Patient/family education;Passive range of motion;Spinal Manipulations;Joint Manipulations;Dry needling;Taping;Manual techniques    PT Next Visit Plan Review response to return to gym type workouts and continued review of HEP effectiveness.  Possible D/C when Pt. demonstrated comfort with plan.    PT Home Exercise Plan    Consulted and Agree with Plan of Care Patient             Patient will benefit from skilled therapeutic intervention in order to improve the following deficits and impairments:  Decreased endurance, Hypomobility, Pain, Decreased strength, Decreased activity tolerance, Decreased mobility, Difficulty walking, Decreased range of motion, Impaired perceived functional ability, Improper body mechanics, Postural dysfunction, Impaired flexibility, Decreased coordination  Visit Diagnosis: Chronic midline low back pain without sciatica  Muscle weakness (generalized)     Problem List Patient Active Problem List   Diagnosis Date Noted   Allergic  rhinitis with a nonallergic component 03/29/2018   Drug allergy 03/29/2018   Insect sting 03/29/2018   Pain in right foot 12/09/2016   Achilles tendon contracture, right 12/09/2016    02/08/2017, PT, DPT, OCS, ATC 07/14/21  8:37 AM    [facility] [address] [place], [place], [place] Phone: [phone]   Fax:  [phone]  Name: Roberta Stewart MRN: [id number] Date of Birth: 1981-03-05

## 2021-07-21 ENCOUNTER — Encounter: Payer: BC Managed Care – PPO | Admitting: Rehabilitative and Restorative Service Providers"

## 2021-07-23 ENCOUNTER — Ambulatory Visit: Payer: BC Managed Care – PPO | Admitting: Rehabilitative and Restorative Service Providers"

## 2021-07-23 ENCOUNTER — Other Ambulatory Visit: Payer: Self-pay

## 2021-07-23 ENCOUNTER — Encounter: Payer: Self-pay | Admitting: Rehabilitative and Restorative Service Providers"

## 2021-07-23 DIAGNOSIS — M6281 Muscle weakness (generalized): Secondary | ICD-10-CM

## 2021-07-23 DIAGNOSIS — M545 Low back pain, unspecified: Secondary | ICD-10-CM

## 2021-07-23 DIAGNOSIS — G8929 Other chronic pain: Secondary | ICD-10-CM | POA: Diagnosis not present

## 2021-07-23 NOTE — Therapy (Signed)
[facility] [address] [place], [place], [address] Phone: [phone]   Fax:  [phone]  Physical Therapy Treatment  Patient Details  Name: Roberta Stewart MRN: [id number] Date of Birth: [birth year] Referring Provider (PT): Newt Minion, MD   Encounter Date: 07/23/2021   PT End of Session - 07/23/21 0805     Visit Number 5    Number of Visits 20    Date for PT Re-Evaluation 08/29/21    Authorization Type [employer] 20% insurance - 24 remaining PT/OT/ST visits for calendar year    PT Start Time 0803    PT Stop Time 0826    PT Time Calculation (min) 23 min    Activity Tolerance Patient tolerated treatment well    Behavior During Therapy [facility] for tasks assessed/performed             Past Medical History:  Diagnosis Date   Allergy    Anemia    Angio-edema    Arthritis    Recurrent upper respiratory infection (URI)    Urticaria     Past Surgical History:  Procedure Laterality Date   FOOT SURGERY     TUBAL LIGATION      There were no vitals filed for this visit.   Subjective Assessment - 07/23/21 0806     Subjective Pt. indicated travel without pain complaints.  Pt. indicated she was doing really well.    Limitations Sitting;Standing;House hold activities    Diagnostic tests MRI impression from 05/2021 : mild degenerative changes.  xrays 05/2021: minimal spondylosis    Patient Stated Goals Reduce pain, improve core strnegth for exercise    Currently in Pain? No/denies    Pain Score 0-No pain    Pain Onset More than a month ago                [facility] PT Assessment - 07/23/21 0001       Assessment   Medical Diagnosis M54.41,G89.29 (ICD-10-CM) - Chronic bilateral low back pain with right-sided sciatica    Referring Provider (PT) Newt Minion, MD    Onset Date/Surgical Date 06/05/20    Hand Dominance Right      Observation/Other Assessments   Focus on Therapeutic Outcomes (FOTO)  update 94%      AROM   Right Hip  External Rotation  36    Right Hip Internal Rotation  39    Left Hip External Rotation  36    Left Hip Internal Rotation  38    Lumbar Flexion movement to ankles, no complaints    Lumbar Extension 100% WFL no complaints      Strength   Left Hip ABduction 5/5                           [facility] Adult PT Treatment/Exercise - 07/23/21 0001       Exercises   Other Exercises  Review of existing HEP c verbal and visual review of handout previously provided.  Cues given on use, frequency of HEP going forward.                     PT Education - 07/23/21 0820     Education Details HEP review for home use    Person(s) Educated Patient    Methods Explanation;Demonstration;Verbal cues    Comprehension Verbalized understanding;Returned demonstration              PT Short Term Goals -  07/11/21 0906       PT SHORT TERM GOAL #1   Title Patient will demonstrate independent use of home exercise program to maintain progress from in clinic treatments.    Time 3    Period Weeks    Status Achieved    Target Date 07/11/21               PT Long Term Goals - 07/23/21 0813       PT LONG TERM GOAL #1   Title Patient will demonstrate/report pain at worst less than or equal to 2/10 to facilitate minimal limitation in daily activity secondary to pain symptoms.    Time 10    Period Weeks    Status Achieved    Target Date 08/29/21      PT LONG TERM GOAL #2   Title Patient will demonstrate independent use of home exercise program to facilitate ability to maintain/progress functional gains from skilled physical therapy services.    Time 10    Period Weeks    Status Achieved    Target Date 08/29/21      PT LONG TERM GOAL #3   Title Pt. will demonstrate FOTO outcome > or = 72 % to indicated reduced disability due to condition.    Time 10    Period Weeks    Status Achieved    Target Date 08/29/21      PT LONG TERM GOAL #4   Title Patient will demonstrate  lumbar extension 100 % WFL s symptoms to facilitate upright standing, walking posture at PLOF s limitation.    Time 10    Period Weeks    Status Achieved    Target Date 08/29/21      PT LONG TERM GOAL #5   Title Pt. will demonstrate hip strength 5/5 throughout bilateral to facilitate usual exercise routine at PLOF s limitation.    Time 10    Period Weeks    Status Achieved    Target Date 08/29/21      PT LONG TERM GOAL #6   Title Pt. will demonstrate Lt hip AROM equal to Rt to facilitate usual mobility at PLOF.    Time 10    Period Weeks    Status Partially Met    Target Date 08/29/21                   Plan - 07/23/21 0826     Clinical Impression Statement Pt. continued to show improvement and reported reduced pain with activity.  See objective data for updated information regarding current presentation.  Pt. has demonstrated reaching most goals at this time and has good knowledge of HEP.  Recommend trial HEP period with d/c after 30 days if no return.    Examination-Activity Limitations Sit;Sleep;Stand;Lift;Carry;Bend    Examination-Participation Restrictions Community Activity;Other;Occupation   exercise   Stability/Clinical Decision Making Stable/Uncomplicated    Rehab Potential Good    PT Frequency Other (comment)   1-2x/week   PT Duration Other (comment)   10 weeks   PT Treatment/Interventions ADLs/Self Care Home Management;Cryotherapy;Electrical Stimulation;Iontophoresis 38m/ml Dexamethasone;Moist Heat;Traction;Balance training;Therapeutic exercise;Therapeutic activities;Functional mobility training;Stair training;Gait training;DME Instruction;Ultrasound;Neuromuscular re-education;Patient/family education;Passive range of motion;Spinal Manipulations;Joint Manipulations;Dry needling;Taping;Manual techniques    PT Next Visit Plan Trial HEP, d/c after 30 days inactivity    PT Home Exercise Plan [email or web address]   Consulted and Agree with Plan of Care Patient              Patient will benefit from  skilled therapeutic intervention in order to improve the following deficits and impairments:  Decreased endurance, Hypomobility, Pain, Decreased strength, Decreased activity tolerance, Decreased mobility, Difficulty walking, Decreased range of motion, Impaired perceived functional ability, Improper body mechanics, Postural dysfunction, Impaired flexibility, Decreased coordination  Visit Diagnosis: Chronic midline low back pain without sciatica  Muscle weakness (generalized)     Problem List Patient Active Problem List   Diagnosis Date Noted   Allergic rhinitis with a nonallergic component 03/29/2018   Drug allergy 03/29/2018   Insect sting 03/29/2018   Pain in right foot 12/09/2016   Achilles tendon contracture, right 12/09/2016    Scot Jun, PT, DPT, OCS, ATC 07/23/21  8:28 AM    [facility] [address] [place], [place], [address] Phone: [phone]   Fax:  [phone]  Name: Roberta Stewart MRN: [id number] Date of Birth: 05-Oct-1980

## 2021-08-01 ENCOUNTER — Encounter: Payer: Self-pay | Admitting: Gastroenterology

## 2021-08-04 DIAGNOSIS — Z13220 Encounter for screening for lipoid disorders: Secondary | ICD-10-CM | POA: Diagnosis not present

## 2021-08-04 DIAGNOSIS — Z0000 Encounter for general adult medical examination without abnormal findings: Secondary | ICD-10-CM | POA: Diagnosis not present

## 2021-08-26 ENCOUNTER — Ambulatory Visit (INDEPENDENT_AMBULATORY_CARE_PROVIDER_SITE_OTHER): Payer: BC Managed Care – PPO | Admitting: Gastroenterology

## 2021-08-26 ENCOUNTER — Encounter: Payer: Self-pay | Admitting: Gastroenterology

## 2021-08-26 VITALS — BP 116/84 | HR 73 | Ht 67.0 in | Wt 181.2 lb

## 2021-08-26 DIAGNOSIS — K642 Third degree hemorrhoids: Secondary | ICD-10-CM | POA: Diagnosis not present

## 2021-08-26 DIAGNOSIS — K921 Melena: Secondary | ICD-10-CM

## 2021-08-26 DIAGNOSIS — R1084 Generalized abdominal pain: Secondary | ICD-10-CM | POA: Diagnosis not present

## 2021-08-26 MED ORDER — SUTAB 1479-225-188 MG PO TABS: 1.0000 | ORAL_TABLET | Freq: Once | ORAL | 0 refills | Status: AC

## 2021-08-26 MED ORDER — SUTAB 1479-225-188 MG PO TABS: 1.0000 | ORAL_TABLET | Freq: Once | ORAL | 0 refills | Status: DC

## 2021-08-26 NOTE — Progress Notes (Signed)
HPI : Roberta Stewart is a very pleasant 41 year old female who is referred to [place] by Dr. Seward Carol for further evaluation management of chronic hemorrhoid symptoms.  Patient states that she started having problems with hemorrhoids during her first pregnancy 20 years ago.  Her symptoms used to be less frequent and more intermittent, but now much more prominent and regular.  Her symptoms consist of prolapsing hemorrhoid tissue with every bowel movement, sometimes requiring manual reduction.  She also notices discharge of mucus-like foul-smelling liquid.  She sees blood on the toilet paper frequently.  Whereas her hemorrhoid symptoms have been longstanding, the blood only started within the past year.  She denies symptoms of pain with the passage of stool.  She denies symptoms of itching or burning.  No significant perianal pain or swelling.  Her bowel movements are fairly regular, usually once a day.  Stools are typically soft and formed.  She denies straining or hard stools.  About once a month she will have problems with constipation or going a few days between bowel movements.  Diarrhea is not a problem for her.  She has been having problems with abdominal pain which is new.  She describes episodes of vague crampy abdominal pain which are not associated with bowel movements or eating.  It is not well localized.  Pain is not severe, but it is bothersome and worrisome for the patient.  She denies any prior history of issues with abdominal pain. Her weight has been stable.  She has no family history of colon cancer, but her mother has had multiple colon polyps. For her hemorrhoids, she has tried taking Preparation H suppositories and creams, but did not notice much improvement in any of her symptoms.   Past Medical History:  Diagnosis Date   Allergy    Anemia    Angio-edema    Arthritis    Recurrent upper respiratory infection (URI)    Urticaria      Past Surgical History:  Procedure  Laterality Date   FOOT SURGERY     TUBAL LIGATION     Family History  Problem Relation Age of Onset   Hypertension Mother    Thyroid disease Mother    Heart disease Maternal Grandmother    Diabetes Maternal Grandfather    Diabetes Paternal Grandfather    Social History   Tobacco Use   Smoking status: Never   Smokeless tobacco: Never  Vaping Use   Vaping Use: Never used  Substance Use Topics   Alcohol use: Not Currently    Alcohol/week: 0.0 standard drinks   Drug use: No   Current Outpatient Medications  Medication Sig Dispense Refill   Clobetasol Prop Emollient Base 0.05 % emollient cream Apply 1 application topically 2 (two) times daily as needed.     EPINEPHrine 0.3 mg/0.3 mL IJ SOAJ injection Inject 3 mLs into the muscle See admin instructions.     Fluticasone Propionate (XHANCE) 93 MCG/ACT EXHU Place 2 sprays into the nose 2 (two) times daily. 16 mL 5   Misc Natural Products (ELDERBERRY IMMUNE COMPLEX) CHEW Chew 1 tablet by mouth See admin instructions.     valACYclovir (VALTREX) 1000 MG tablet Take 1 tablet by mouth daily as needed.     azelastine (ASTELIN) 0.1 % nasal spray Place 2 sprays into both nostrils 2 (two) times daily. (Patient not taking: Reported on 08/26/2021) 30 mL 0   doxycycline (VIBRAMYCIN) 100 MG capsule Take 1 capsule (100 mg total) by mouth 2 (two) times  daily. (Patient not taking: Reported on 08/26/2021) 20 capsule 0   fluconazole (DIFLUCAN) 150 MG tablet Take 1 tablet (150 mg total) by mouth daily. (Patient not taking: Reported on 08/26/2021) 1 tablet 1   lactobacillus acidophilus & bulgar (LACTINEX) chewable tablet Chew 1 tablet by mouth daily. (Patient not taking: Reported on 08/26/2021)     Multiple Vitamin (MULTIVITAMIN WITH MINERALS) TABS tablet Take 1 tablet by mouth daily. (Patient not taking: Reported on 08/26/2021)     No current facility-administered medications for this visit.   Allergies  Allergen Reactions   Keflex [Cephalexin] Swelling    Yellow Jacket Venom Anaphylaxis    Yellow Jackets and Wasps   Bee Venom Other (See Comments)   Darvon [Propoxyphene] Nausea And Vomiting    darvocet   Lidocaine Other (See Comments)     Review of Systems: All systems reviewed and negative except where noted in HPI.    No results found.  Physical Exam: BP 116/84    Pulse 73    Ht 5\' 7"  (1.702 m)    Wt 181 lb 3.2 oz (82.2 kg)    SpO2 99%    BMI 28.38 kg/m  Constitutional: Pleasant,well-developed, African-American female in no acute distress. HEENT: Normocephalic and atraumatic. Conjunctivae are normal. No scleral icterus. Neck supple.  Cardiovascular: Normal rate, regular rhythm.  Pulmonary/chest: Effort normal and breath sounds normal. No wheezing, rales or rhonchi. Abdominal: Soft, nondistended, nontender. Bowel sounds active throughout. There are no masses palpable. No hepatomegaly. Extremities: no edema Rectal: Posterior midline skin tag present, prolapsed internal hemorrhoid present, easily reduced manually, no evidence of anal fissure.  Digital rectal exam not performed.  Rectal exam performed in presence of female chaperone Estill Bamberg, [place]) Neurological: Alert and oriented to person place and time. Skin: Skin is warm and dry. No rashes noted. Psychiatric: Normal mood and affect. Behavior is normal.  CBC    Component Value Date/Time   WBC 9.5 02/25/2018 0830   RBC 5.36 (H) 02/25/2018 0830   HGB 13.8 02/25/2018 0830   HCT 42.3 02/25/2018 0830   PLT 250 02/25/2018 0830   MCV 78.9 02/25/2018 0830   MCH 25.7 (L) 02/25/2018 0830   MCHC 32.6 02/25/2018 0830   RDW 13.5 02/25/2018 0830   LYMPHSABS 1.2 02/25/2018 0830   MONOABS 0.4 02/25/2018 0830   EOSABS 0.1 02/25/2018 0830   BASOSABS 0.0 02/25/2018 0830    CMP     Component Value Date/Time   NA 137 02/25/2018 0830   K 3.9 02/25/2018 0830   CL 105 02/25/2018 0830   CO2 21 (L) 02/25/2018 0830   GLUCOSE 105 (H) 02/25/2018 0830   BUN 14 02/25/2018 0830   CREATININE  1.00 02/25/2018 0830   CALCIUM 9.2 02/25/2018 0830   PROT 7.0 02/25/2018 0830   ALBUMIN 4.0 02/25/2018 0830   AST 17 02/25/2018 0830   ALT 14 02/25/2018 0830   ALKPHOS 41 02/25/2018 0830   BILITOT 1.3 (H) 02/25/2018 0830   GFRNONAA >60 02/25/2018 0830   GFRAA >60 02/25/2018 0830     ASSESSMENT AND PLAN: 41 year old female with longstanding hemorrhoid symptoms, with new onset hematochezia and abdominal pain.  Exam notable for skin tag and prolapsed internal hemorrhoid.  Her hematochezia is almost certainly from these hemorrhoids, given the new onset of the blood in her new abdominal pain, colonoscopy is reasonable.  Regarding her hemorrhoids, we discussed the pathophysiology and management of hemorrhoids.  Her bowel habits seem pretty well optimized already, and most of her symptoms are  related to prolapse, which is not likely to improve significantly with conservative measures.  I recommended she consider hemorrhoid banding, and she is agreeable to this.  We will schedule her for hemorrhoid banding.  In the meantime I suggest that she try taking Metamucil daily, and if this does resolve her hemorrhoid symptoms, she can cancel the banding.  Abdominal pain, hematochezia - Colonoscopy  Grade III internal hemorrhoids -Banding -Metamucil daily  The details, risks (including bleeding, perforation, infection, missed lesions, medication reactions and possible hospitalization or surgery if complications occur), benefits, and alternatives to colonoscopy with possible biopsy and possible polypectomy were discussed with the patient and she consents to proceed.   Shandell Giovanni E. Candis Schatz, MD [facility]  Seward Carol, MD

## 2021-08-26 NOTE — Patient Instructions (Addendum)
If you are age 42 or older, your body mass index should be between 23-30. Your Body mass index is 28.38 kg/m. If this is out of the aforementioned range listed, please consider follow up with your Primary Care Provider.  If you are age 52 or younger, your body mass index should be between 19-25. Your Body mass index is 28.38 kg/m. If this is out of the aformentioned range listed, please consider follow up with your Primary Care Provider.   Start metamucil daily.  You have been scheduled for a colonoscopy. Please follow written instructions given to you at your visit today.  Please pick up your prep supplies at the pharmacy within the next 1-3 days. If you use inhalers (even only as needed), please bring them with you on the day of your procedure.   The [facility] providers would like to encourage you to use [facility] to communicate with providers for non-urgent requests or questions.  Due to long hold times on the telephone, sending your provider a message by [facility] may be a faster and more efficient way to get a response.  Please allow 48 business hours for a response.  Please remember that this is for non-urgent requests.   It was a pleasure to see you today!  Thank you for trusting me with your gastrointestinal care!    Scott E.Tomasa Rand, MD

## 2021-09-24 ENCOUNTER — Encounter: Payer: Self-pay | Admitting: Gastroenterology

## 2021-10-01 ENCOUNTER — Ambulatory Visit (AMBULATORY_SURGERY_CENTER): Payer: BC Managed Care – PPO | Admitting: Gastroenterology

## 2021-10-01 ENCOUNTER — Encounter: Payer: Self-pay | Admitting: Gastroenterology

## 2021-10-01 ENCOUNTER — Other Ambulatory Visit: Payer: Self-pay

## 2021-10-01 ENCOUNTER — Telehealth: Payer: Self-pay | Admitting: Gastroenterology

## 2021-10-01 VITALS — BP 122/85 | HR 65 | Temp 97.3°F | Resp 12 | Ht 67.0 in | Wt 187.0 lb

## 2021-10-01 DIAGNOSIS — K921 Melena: Secondary | ICD-10-CM

## 2021-10-01 DIAGNOSIS — R1084 Generalized abdominal pain: Secondary | ICD-10-CM

## 2021-10-01 DIAGNOSIS — K642 Third degree hemorrhoids: Secondary | ICD-10-CM | POA: Diagnosis not present

## 2021-10-01 DIAGNOSIS — K5730 Diverticulosis of large intestine without perforation or abscess without bleeding: Secondary | ICD-10-CM

## 2021-10-01 MED ORDER — SODIUM CHLORIDE 0.9 % IV SOLN: 500.0000 mL | Freq: Once | INTRAVENOUS | Status: DC

## 2021-10-01 NOTE — Telephone Encounter (Signed)
Inbound call from patient requesting that her colon information from procedure be sent to her PCP Dr. Seward Carol. Please advise.  ?

## 2021-10-01 NOTE — Progress Notes (Signed)
[facility] History and Physical ? ? ?Primary Care Physician:  Renford Dills, MD ? ? ?Reason for Procedure:   Hematochezia, abdominal pain ? ?Plan:    Colonoscopy ? ? ? ? ?HPI: Roberta Stewart is a 41 y.o. female undergoing colonoscopy due to new onset abdominal pain and hematochezia.  She has had hemorrhoid symptoms for many years, but did not start seeing blood in the stool until this past year.  She also began having vague, bothersome poorly localized abdominal pain around that same time.  She has no family history of colon cancer but her mother has had polyps. ? ? ?Past Medical History:  ?Diagnosis Date  ? Allergy   ? Anemia   ? Angio-edema   ? Arthritis   ? GERD (gastroesophageal reflux disease)   ? Recurrent upper respiratory infection (URI)   ? Urticaria   ? ? ?Past Surgical History:  ?Procedure Laterality Date  ? FOOT SURGERY    ? TUBAL LIGATION    ? ? ?Prior to Admission medications   ?Medication Sig Start Date End Date Taking? Authorizing Provider  ?azelastine (ASTELIN) 0.1 % nasal spray Place 2 sprays into both nostrils 2 (two) times daily. 02/11/20  Yes Cathie Hoops, Amy V, PA-C  ?Fluticasone Propionate (XHANCE) 93 MCG/ACT EXHU Place 2 sprays into the nose 2 (two) times daily. 03/29/18  Yes Bobbitt, Heywood Iles, MD  ?Misc Natural Products [facility] IMMUNE COMPLEX) CHEW Chew 1 tablet by mouth See admin instructions.   Yes [provider]  ?valACYclovir (VALTREX) 1000 MG tablet Take 1 tablet by mouth daily as needed. 06/24/18  Yes [provider]  ?Clobetasol Prop Emollient Base 0.05 % emollient cream Apply 1 application topically 2 (two) times daily as needed.    [provider]  ?doxycycline (VIBRAMYCIN) 100 MG capsule Take 1 capsule (100 mg total) by mouth 2 (two) times daily. ?Patient not taking: Reported on 08/26/2021 05/30/21   Tomi Bamberger, PA-C  ?EPINEPHrine 0.3 mg/0.3 mL IJ SOAJ injection Inject 3 mLs into the muscle See admin instructions.    [provider]  ?fluconazole (DIFLUCAN) 150 MG tablet Take 1 tablet (150 mg total) by mouth daily. ?Patient not taking: Reported on 08/26/2021 05/30/21   Tomi Bamberger, PA-C  ?lactobacillus acidophilus & bulgar (LACTINEX) chewable tablet Chew 1 tablet by mouth daily. ?Patient not taking: Reported on 08/26/2021    [provider]  ?Multiple Vitamin (MULTIVITAMIN WITH MINERALS) TABS tablet Take 1 tablet by mouth daily. ?Patient not taking: Reported on 08/26/2021    [provider]  ? ? ?Current Outpatient Medications  ?Medication Sig Dispense Refill  ? azelastine (ASTELIN) 0.1 % nasal spray Place 2 sprays into both nostrils 2 (two) times daily. 30 mL 0  ? Fluticasone Propionate (XHANCE) 93 MCG/ACT EXHU Place 2 sprays into the nose 2 (two) times daily. 16 mL 5  ? Misc Natural Products (ELDERBERRY IMMUNE COMPLEX) CHEW Chew 1 tablet by mouth See admin instructions.    ? valACYclovir (VALTREX) 1000 MG tablet Take 1 tablet by mouth daily as needed.    ? Clobetasol Prop Emollient Base 0.05 % emollient cream Apply 1 application topically 2 (two) times daily as needed.    ? doxycycline (VIBRAMYCIN) 100 MG capsule Take 1 capsule (100 mg total) by mouth 2 (two) times daily. (Patient not taking: Reported on 08/26/2021) 20 capsule 0  ? EPINEPHrine 0.3 mg/0.3 mL IJ SOAJ injection Inject 3 mLs into the muscle See admin instructions.    ? fluconazole (  DIFLUCAN) 150 MG tablet Take 1 tablet (150 mg total) by mouth daily. (Patient not taking: Reported on 08/26/2021) 1 tablet 1  ? lactobacillus acidophilus & bulgar (LACTINEX) chewable tablet Chew 1 tablet by mouth daily. (Patient not taking: Reported on 08/26/2021)    ? Multiple Vitamin (MULTIVITAMIN WITH MINERALS) TABS tablet Take 1 tablet by mouth daily. (Patient not taking: Reported on 08/26/2021)    ? ?Current Facility-Administered Medications  ?Medication Dose Route Frequency Provider Last Rate Last Admin  ? 0.9 %  sodium chloride infusion  500 mL Intravenous Once  Jenel Lucks, MD      ? ? ?Allergies as of 10/01/2021 - Review Complete 10/01/2021  ?Allergen Reaction Noted  ? Keflex [cephalexin] Swelling 02/25/2018  ? Yellow jacket venom Anaphylaxis 07/04/2018  ? Bee venom Other (See Comments) 08/04/2021  ? Darvon [propoxyphene] Nausea And Vomiting 02/24/2018  ? Lidocaine Other (See Comments) 08/04/2021  ? ? ?Family History  ?Problem Relation Age of Onset  ? Hypertension Mother   ? Thyroid disease Mother   ? Heart disease Maternal Grandmother   ? Diabetes Maternal Grandfather   ? Diabetes Paternal Grandfather   ? ? ?Social History  ? ?Socioeconomic History  ? Marital status: Married  ?  Spouse name: Not on file  ? Number of children: Not on file  ? Years of education: Not on file  ? Highest education level: Not on file  ?Occupational History  ? Not on file  ?Tobacco Use  ? Smoking status: Never  ? Smokeless tobacco: Never  ?Vaping Use  ? Vaping Use: Never used  ?Substance and Sexual Activity  ? Alcohol use: Yes  ?  Alcohol/week: 2.0 standard drinks  ?  Types: 2 Glasses of wine per week  ? Drug use: No  ? Sexual activity: Not Currently  ?Other Topics Concern  ? Not on file  ?Social History Narrative  ? Not on file  ? ?Social Determinants of Health  ? ?Financial Resource Strain: Not on file  ?Food Insecurity: Not on file  ?Transportation Needs: Not on file  ?Physical Activity: Not on file  ?Stress: Not on file  ?Social Connections: Not on file  ?Intimate Partner Violence: Not on file  ? ? ?Review of Systems: ? ?All other review of systems negative except as mentioned in the HPI. ? ?Physical Exam: ?Vital signs ?BP 108/73   Pulse 87   Temp (!) 97.3 ?F (36.3 ?C)   Ht 5\' 7"  (1.702 m)   Wt 187 lb (84.8 kg)   SpO2 100%   BMI 29.29 kg/m?  ? ?General:   Alert,  Well-developed, well-nourished, pleasant and cooperative in NAD ?Airway:  Mallampati 1 ?Lungs:  Clear throughout to auscultation.   ?Heart:  Regular rate and rhythm; no murmurs, clicks, rubs,  or gallops. ?Abdomen:   Soft, nontender and nondistended. Normal bowel sounds.   ?Neuro/Psych:  Normal mood and affect. A and O x 3 ? ? ?Flavius Repsher E. , MD ?[facility] ? ?

## 2021-10-01 NOTE — Progress Notes (Signed)
No problems noted in the recovery room. maw 

## 2021-10-01 NOTE — Op Note (Signed)
[facility] ?Patient Name: Roberta Stewart ?Procedure Date: 10/01/2021 9:05 AM ?MRN: [id number] ?Endoscopist: Djuna Frechette E. Tomasa Randunningham , MD ?Age: 41 ?Referring MD:  ?Date of Birth: 27-Apr-1981 ?Gender: Female ?Account #: [id number] ?Procedure:                Colonoscopy ?Indications:              Generalized abdominal pain, Hematochezia ?Medicines:                Monitored Anesthesia Care ?Procedure:                Pre-Anesthesia Assessment: ?                          - Prior to the procedure, a History and Physical  ?                          was performed, and patient medications and  ?                          allergies were reviewed. The patient's tolerance of  ?                          previous anesthesia was also reviewed. The risks  ?                          and benefits of the procedure and the sedation  ?                          options and risks were discussed with the patient.  ?                          All questions were answered, and informed consent  ?                          was obtained. Prior Anticoagulants: The patient has  ?                          taken no previous anticoagulant or antiplatelet  ?                          agents. ASA Grade Assessment: II - A patient with  ?                          mild systemic disease. After reviewing the risks  ?                          and benefits, the patient was deemed in  ?                          satisfactory condition to undergo the procedure. ?                          After obtaining informed consent, the colonoscope  ?  was passed under direct vision. Throughout the  ?                          procedure, the patient's blood pressure, pulse, and  ?                          oxygen saturations were monitored continuously. The  ?                          Olympus CF-HQ190L ([id number]) Colonoscope was  ?                          introduced through the anus and advanced to the the  ?                          terminal ileum,  with identification of the  ?                          appendiceal orifice and IC valve. The colonoscopy  ?                          was performed without difficulty. The patient  ?                          tolerated the procedure well. The quality of the  ?                          bowel preparation was good. The terminal ileum,  ?                          ileocecal valve, appendiceal orifice, and rectum  ?                          were photographed. The bowel preparation used was  ?                          Sutab via split dose instruction. ?Scope In: 9:34:11 AM ?Scope Out: 9:44:52 AM ?Scope Withdrawal Time: 0 hours 7 minutes 42 seconds  ?Total Procedure Duration: 0 hours 10 minutes 41 seconds  ?Findings:                 Hemorrhoids were found on perianal exam. ?                          The digital rectal exam was normal. Pertinent  ?                          negatives include normal sphincter tone and no  ?                          palpable rectal lesions. ?                          Multiple small and large-mouthed diverticula were  ?  found in the ascending colon. ?                          The exam was otherwise normal throughout the  ?                          examined colon. ?                          The terminal ileum appeared normal. ?                          Non-bleeding internal hemorrhoids were found during  ?                          retroflexion. The hemorrhoids were Grade III  ?                          (internal hemorrhoids that prolapse but require  ?                          manual reduction). ?                          No additional abnormalities were found on  ?                          retroflexion. ?Complications:            No immediate complications. ?Estimated Blood Loss:     Estimated blood loss: none. ?Impression:               - Hemorrhoids found on perianal exam (grade III).  ?                          This is the source of the patient's hematochezia ?                           - Diverticulosis in the ascending colon. ?                          - The examined portion of the ileum was normal. ?                          - Non-bleeding internal hemorrhoids. ?                          - No specimens collected. ?Recommendation:           - Patient has a contact number available for  ?                          emergencies. The signs and symptoms of potential  ?                          delayed complications were discussed with the  ?  patient. Return to normal activities tomorrow.  ?                          Written discharge instructions were provided to the  ?                          patient. ?                          - Resume previous diet. ?                          - Continue present medications. ?                          - Repeat colonoscopy in 10 years for screening  ?                          purposes. ?Tyshika Baldridge E. Tomasa Rand, MD ?10/01/2021 9:54:22 AM ?This report has been signed electronically. ?

## 2021-10-01 NOTE — Patient Instructions (Addendum)
Handouts were given to your care partner on Hemorrhoids, diverticulosis and a high fiber diet with liberal fluid intake. ?You may resume your current medications today. ?Repeat colonoscopy in 10 years for screening purposes. ?Please call if any questions or concerns. ?  ? ? ?YOU HAD AN ENDOSCOPIC PROCEDURE TODAY AT [facility]:   Refer to the procedure report that was given to you for any specific questions about what was found during the examination.  If the procedure report does not answer your questions, please call your gastroenterologist to clarify.  If you requested that your care partner not be given the details of your procedure findings, then the procedure report has been included in a sealed envelope for you to review at your convenience later. ? ?YOU SHOULD EXPECT: Some feelings of bloating in the abdomen. Passage of more gas than usual.  Walking can help get rid of the air that was put into your GI tract during the procedure and reduce the bloating. If you had a lower endoscopy (such as a colonoscopy or flexible sigmoidoscopy) you may notice spotting of blood in your stool or on the toilet paper. If you underwent a bowel prep for your procedure, you may not have a normal bowel movement for a few days. ? ?Please Note:  You might notice some irritation and congestion in your nose or some drainage.  This is from the oxygen used during your procedure.  There is no need for concern and it should clear up in a day or so. ? ?SYMPTOMS TO REPORT IMMEDIATELY: ? ?Following lower endoscopy (colonoscopy or flexible sigmoidoscopy): ? Excessive amounts of blood in the stool ? Significant tenderness or worsening of abdominal pains ? Swelling of the abdomen that is new, acute ? Fever of 100?F or higher ? ?For urgent or emergent issues, a gastroenterologist can be reached at any hour by calling [phone]. ?Do not use MyChart messaging for urgent concerns.  ? ? ?DIET:  We do recommend a small meal at  first, but then you may proceed to your regular diet.  Drink plenty of fluids but you should avoid alcoholic beverages for 24 hours. ? ?ACTIVITY:  You should plan to take it easy for the rest of today and you should NOT DRIVE or use heavy machinery until tomorrow (because of the sedation medicines used during the test).   ? ?FOLLOW UP: ?Our staff will call the number listed on your records 48-72 hours following your procedure to check on you and address any questions or concerns that you may have regarding the information given to you following your procedure. If we do not reach you, we will leave a message.  We will attempt to reach you two times.  During this call, we will ask if you have developed any symptoms of COVID 19. If you develop any symptoms (ie: fever, flu-like symptoms, shortness of breath, cough etc.) before then, please call [phone].  If you test positive for Covid 19 in the 2 weeks post procedure, please call and report this information to [place].   ? ?If any biopsies were taken you will be contacted by phone or by letter within the next 1-3 weeks.  Please call us at [phone] if you have not heard about the biopsies in 3 weeks.  ? ? ?SIGNATURES/CONFIDENTIALITY: ?You and/or your care partner have signed paperwork which will be entered into your electronic medical record.  These signatures attest to the fact that that the information above on your  After Visit Summary has been reviewed and is understood.  Full responsibility of the confidentiality of this discharge information lies with you and/or your care-partner.  ?

## 2021-10-01 NOTE — Progress Notes (Signed)
Report to PACU, RN, vss, BBS= Clear.  

## 2021-10-02 NOTE — Telephone Encounter (Signed)
Chart Review Routing History Since 10/03/2020 [facility] Full Routing History) ? ?Recipients Sent On Sent By Routed Reports  ? Renford Dills, MD  ? 10/02/2021  9:54 AM Deon Pilling, LPN Procedure visit on 10/01/2021 with Jenel Lucks, MD  ?  ?  Cover Page Message : Colonoscopy report for your review and records  ?  ?  ? ?

## 2021-10-03 ENCOUNTER — Telehealth: Payer: Self-pay | Admitting: *Deleted

## 2021-10-03 NOTE — Telephone Encounter (Signed)
?  Follow up Call- ? ? ?  10/01/2021  ?  9:08 AM  ?Call back number  ?Post procedure Call Back phone  # [phone]  ?Permission to leave phone message Yes  ?  ? ?Patient questions: ? ?Do you have a fever, pain , or abdominal swelling? No. ?Pain Score  0 * ? ?Have you tolerated food without any problems? Yes.   ? ?Have you been able to return to your normal activities? Yes.   ? ?Do you have any questions about your discharge instructions: ?Diet   No. ?Medications  No. ?Follow up visit  No. ? ?Do you have questions or concerns about your Care? No. ? ?Actions: ?* If pain score is 4 or above: ?No action needed, pain <4. ? ? ?

## 2021-10-07 ENCOUNTER — Encounter: Payer: Self-pay | Admitting: Emergency Medicine

## 2021-10-07 ENCOUNTER — Encounter: Payer: BC Managed Care – PPO | Admitting: Gastroenterology

## 2021-10-07 ENCOUNTER — Other Ambulatory Visit: Payer: Self-pay

## 2021-10-07 ENCOUNTER — Ambulatory Visit
Admission: EM | Admit: 2021-10-07 | Discharge: 2021-10-07 | Disposition: A | Discharge status: Home / Self Care | Payer: BC Managed Care – PPO | Attending: Internal Medicine | Admitting: Internal Medicine

## 2021-10-07 DIAGNOSIS — J029 Acute pharyngitis, unspecified: Secondary | ICD-10-CM | POA: Insufficient documentation

## 2021-10-07 DIAGNOSIS — H109 Unspecified conjunctivitis: Secondary | ICD-10-CM | POA: Insufficient documentation

## 2021-10-07 DIAGNOSIS — J069 Acute upper respiratory infection, unspecified: Secondary | ICD-10-CM | POA: Insufficient documentation

## 2021-10-07 LAB — POCT RAPID STREP A (OFFICE): Rapid Strep A Screen: NEGATIVE

## 2021-10-07 MED ORDER — ERYTHROMYCIN 5 MG/GM OP OINT: TOPICAL_OINTMENT | OPHTHALMIC | 0 refills | Status: DC

## 2021-10-07 MED ORDER — PREDNISONE 20 MG PO TABS: 40.0000 mg | ORAL_TABLET | Freq: Every day | ORAL | 0 refills | Status: AC

## 2021-10-07 NOTE — ED Triage Notes (Signed)
Patient c/o left eye drainage and redness x 2 days, no injury to the eye.  Patient is also having seasonal allergies, nasal drainage, Flonase taken Claritin, Singulair and Flonase. ?

## 2021-10-07 NOTE — Discharge Instructions (Signed)
You have been prescribed prednisone and antibiotic ointment for your symptoms.  Rapid strep was negative.  Throat culture and viral testing are pending.  We will call if they are positive.  Please follow-up if symptoms persist or worsen. ?

## 2021-10-07 NOTE — ED Provider Notes (Addendum)
?[name] ? ? ? ?CSN: [id number] ?Arrival date & time: 10/07/21  0810 ? ? ?  ? ?History   ?Chief Complaint ?Chief Complaint  ?Patient presents with  ? Eye Drainage  ? ? ?HPI ?Roberta Stewart is a 41 y.o. female.  ? ?Patient presents with left eye drainage and irritation, nasal congestion, runny nose, nasal drainage, cough, sore throat.  Eye symptoms started approximately 2 days ago.  Patient has blurry vision that she is attributing to film over her eye.  Denies trauma or foreign body to eye. Patient does not wear contacts.  Patient reports upper respiratory symptoms have been present for a few weeks with sore throat that developed a few days ago. Denies any known fevers or sick contacts.  Denies chest pain, shortness of breath, ear pain, nausea, vomiting, diarrhea, abdominal pain.  Patient has been taking Flonase, Claritin, Singulair with minimal improvement. ? ? ? ?Past Medical History:  ?Diagnosis Date  ? Allergy   ? Anemia   ? Angio-edema   ? Arthritis   ? GERD (gastroesophageal reflux disease)   ? Recurrent upper respiratory infection (URI)   ? Urticaria   ? ? ?Patient Active Problem List  ? Diagnosis Date Noted  ? Allergic rhinitis with a nonallergic component 03/29/2018  ? Drug allergy 03/29/2018  ? Insect sting 03/29/2018  ? Pain in right foot 12/09/2016  ? 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 ? ?Past Surgical History:  ?Procedure Laterality Date  ? FOOT SURGERY    ? TUBAL LIGATION    ? ? ?OB History   ?No obstetric history on file. ?  ? ? ? ?Home Medications   ? ?Prior to Admission medications   ?Medication Sig Start Date End Date Taking? Authorizing Provider  ?Clobetasol Prop Emollient Base 0.05 % emollient cream Apply 1 application topically 2 (two) times daily as needed.   Yes [provider]  ?EPINEPHrine 0.3 mg/0.3 mL IJ SOAJ injection Inject 3 mLs into the muscle See admin instructions.   Yes [provider]  ?erythromycin ophthalmic ointment Place a 1/2  inch ribbon of ointment into the lower eyelid 4 times daily for 7 days. 10/07/21  Yes Gustavus Bryant, FNP  ?Misc Natural Products (ELDERBERRY IMMUNE COMPLEX) CHEW Chew 1 tablet by mouth See admin instructions.   Yes [provider]  ?predniSONE (DELTASONE) 20 MG tablet Take 2 tablets (40 mg total) by mouth daily for 5 days. 10/07/21 10/12/21 Yes Cyntia Staley, Acie Fredrickson, FNP  ?valACYclovir (VALTREX) 1000 MG tablet Take 1 tablet by mouth daily as needed. 06/24/18  Yes [provider]  ?azelastine (ASTELIN) 0.1 % nasal spray Place 2 sprays into both nostrils 2 (two) times daily. 02/11/20   Cathie Hoops, Amy V, PA-C  ?doxycycline (VIBRAMYCIN) 100 MG capsule Take 1 capsule (100 mg total) by mouth 2 (two) times daily. ?Patient not taking: Reported on 08/26/2021 05/30/21   Tomi Bamberger, PA-C  ?fluconazole (DIFLUCAN) 150 MG tablet Take 1 tablet (150 mg total) by mouth daily. ?Patient not taking: Reported on 08/26/2021 05/30/21   Tomi Bamberger, PA-C  ?Fluticasone Propionate (XHANCE) 93 MCG/ACT EXHU Place 2 sprays into the nose 2 (two) times daily. 03/29/18   Bobbitt, Heywood Iles, MD  ?lactobacillus acidophilus & bulgar (LACTINEX) chewable tablet Chew 1 tablet by mouth daily. ?Patient not taking: Reported on 08/26/2021    [provider]  ?Multiple Vitamin (MULTIVITAMIN WITH MINERALS) TABS tablet Take 1 tablet by mouth daily. ?Patient not taking: Reported on 08/26/2021  [provider]  ? ? ?Family History ?Family History  ?Problem Relation Age of Onset  ? Hypertension Mother   ? Thyroid disease Mother   ? Heart disease Maternal Grandmother   ? Diabetes Maternal Grandfather   ? Diabetes Paternal Grandfather   ? ? ?Social History ?Social History  ? ?Tobacco Use  ? Smoking status: Never  ? Smokeless tobacco: Never  ?Vaping Use  ? Vaping Use: Never used  ?Substance Use Topics  ? Alcohol use: Yes  ?  Alcohol/week: 2.0 standard drinks  ?  Types: 2 Glasses of wine per week  ? Drug use: No  ? ? ? ?Allergies   ?Keflex  [cephalexin], Yellow jacket venom, Bee venom, Darvon [propoxyphene], and Lidocaine ? ? ?Review of Systems ?Review of Systems ?Per HPI ? ?Physical Exam ?Triage Vital Signs ?ED Triage Vitals [10/07/21 0840]  ?Enc Vitals Group  ?   BP (!) 146/93  ?   Pulse Rate 87  ?   Resp 18  ?   Temp 98.2 ?F (36.8 ?C)  ?   Temp Source Oral  ?   SpO2 99 %  ?   Weight 170 lb (77.1 kg)  ?   Height 5\' 7"  (1.702 m)  ?   Head Circumference   ?   Peak Flow   ?   Pain Score 3  ?   Pain Loc   ?   Pain Edu?   ?   Excl. in GC?   ? ?No data found. ? ?Updated Vital Signs ?BP (!) 146/93 (BP Location: Right Arm)   Pulse 87   Temp 98.2 ?F (36.8 ?C) (Oral)   Resp 18   Ht 5\' 7"  (1.702 m)   Wt 170 lb (77.1 kg)   LMP 09/24/2021 (Approximate)   SpO2 99%   BMI 26.63 kg/m?  ? ?Visual Acuity ?Right Eye Distance:   ?Left Eye Distance:   ?Bilateral Distance:   ? ?Right Eye Near:   ?Left Eye Near:    ?Bilateral Near:    ? ?Physical Exam ?Constitutional:   ?   General: She is not in acute distress. ?   Appearance: Normal appearance. She is not toxic-appearing or diaphoretic.  ?HENT:  ?   Head: Normocephalic and atraumatic.  ?   Right Ear: Tympanic membrane and ear canal normal.  ?   Left Ear: Tympanic membrane and ear canal normal.  ?   Nose: Congestion present.  ?   Mouth/Throat:  ?   Mouth: Mucous membranes are moist.  ?   Pharynx: Posterior oropharyngeal erythema present.  ?Eyes:  ?   Extraocular Movements: Extraocular movements intact.  ?   Conjunctiva/sclera: Conjunctivae normal.  ?   Pupils: Pupils are equal, round, and reactive to light.  ?Cardiovascular:  ?   Rate and Rhythm: Normal rate and regular rhythm.  ?   Pulses: Normal pulses.  ?   Heart sounds: Normal heart sounds.  ?Pulmonary:  ?   Effort: Pulmonary effort is normal. No respiratory distress.  ?   Breath sounds: Normal breath sounds. No stridor. No wheezing, rhonchi or rales.  ?Abdominal:  ?   General: Abdomen is flat. Bowel sounds are normal.  ?   Palpations: Abdomen is soft.   ?Musculoskeletal:     ?   General: Normal range of motion.  ?   Cervical back: Normal range of motion.  ?Skin: ?   General: Skin is warm and dry.  ?Neurological:  ?   General: No focal  deficit present.  ?   Mental Status: She is alert and oriented to person, place, and time. Mental status is at baseline.  ?Psychiatric:     ?   Mood and Affect: Mood normal.     ?   Behavior: Behavior normal.  ? ? ? ?UC Treatments / Results  ?Labs ?(all labs ordered are listed, but only abnormal results are displayed) ?Labs Reviewed  ?CULTURE, GROUP A STREP [facility]  ?COVID-19, FLU A+B NAA  ?POCT RAPID STREP A (OFFICE)  ? ? ?EKG ? ? ?Radiology ?No results found. ? ?Procedures ?Procedures (including critical care time) ? ?Medications Ordered in UC ?Medications - No data to display ? ?Initial Impression / Assessment and Plan / UC Course  ?I have reviewed the triage vital signs and the nursing notes. ? ?Pertinent labs & imaging results that were available during my care of the patient were reviewed by me and considered in my medical decision making (see chart for details). ? ?  ? ?Patient presents with symptoms likely from a viral upper respiratory infection. Differential includes bacterial pneumonia, sinusitis, allergic rhinitis, COVID-19, flu. Do not suspect underlying cardiopulmonary process. Symptoms seem unlikely related to ACS, CHF or COPD exacerbations, pneumonia, pneumothorax. Patient is nontoxic appearing and not in need of emergent medical intervention.  Rapid strep negative.  Throat culture, COVID-19, flu test pending. ? ?Do not think that chest imaging is necessary given no adventitious lung sounds on exam, no shortness of breath, no signs of respiratory compromise. ? ?Recommended symptom control with over the counter medications.  Prednisone sent to help decrease inflammation associated with symptoms.  I do think that short course and low-dose would be safe given the colonoscopy that was recent was fairly unremarkable.  Will  treat left bacterial conjunctivitis with erythromycin ointment.  Visual acuity normal. ? ?Return if symptoms fail to improve in 1-2 weeks or you develop shortness of breath, chest pain, severe headache. Patient states u

## 2021-10-09 LAB — COVID-19, FLU A+B NAA
Influenza A, NAA: NOT DETECTED
Influenza B, NAA: NOT DETECTED
SARS-CoV-2, NAA: NOT DETECTED

## 2021-10-10 LAB — CULTURE, GROUP A STREP (THRC)

## 2021-10-23 DIAGNOSIS — Z6827 Body mass index (BMI) 27.0-27.9, adult: Secondary | ICD-10-CM | POA: Diagnosis not present

## 2021-10-23 DIAGNOSIS — Z01419 Encounter for gynecological examination (general) (routine) without abnormal findings: Secondary | ICD-10-CM | POA: Diagnosis not present

## 2021-10-23 DIAGNOSIS — Z1231 Encounter for screening mammogram for malignant neoplasm of breast: Secondary | ICD-10-CM | POA: Diagnosis not present

## 2021-10-23 DIAGNOSIS — Z23 Encounter for immunization: Secondary | ICD-10-CM | POA: Diagnosis not present

## 2021-11-06 DIAGNOSIS — B3731 Acute candidiasis of vulva and vagina: Secondary | ICD-10-CM | POA: Diagnosis not present

## 2021-12-10 ENCOUNTER — Telehealth: Payer: Self-pay | Admitting: Gastroenterology

## 2021-12-10 NOTE — Telephone Encounter (Signed)
Inbound call from patient wanting to reschedule for Banding procedure. Patient would like to speak with a nurse to discuss rectal bleeding and Hemorid. Please give patient a call back to advise.  Thank you

## 2021-12-11 NOTE — Telephone Encounter (Signed)
Pt states she is having BRBPR from hemorrhoids. She is wanting to reschedule hem banding. Pt scheduled for hem banding 12/30/21 @1 :50pm. Pt instructed to try prep h supp, sitz bathes and to use moist wipes instead of toilet paper until her appt. Pt verbalized understanding.

## 2021-12-16 DIAGNOSIS — Z23 Encounter for immunization: Secondary | ICD-10-CM | POA: Diagnosis not present

## 2021-12-30 ENCOUNTER — Encounter: Payer: Self-pay | Admitting: Gastroenterology

## 2021-12-30 ENCOUNTER — Ambulatory Visit: Payer: BC Managed Care – PPO | Admitting: Gastroenterology

## 2021-12-30 VITALS — BP 128/74 | HR 77 | Ht 67.0 in | Wt 180.2 lb

## 2021-12-30 DIAGNOSIS — K642 Third degree hemorrhoids: Secondary | ICD-10-CM | POA: Diagnosis not present

## 2021-12-30 NOTE — Progress Notes (Signed)
PROCEDURE NOTE: The patient presents with symptomatic grade 3  hemorrhoids, requesting rubber band ligation of his/her hemorrhoidal disease.  All risks, benefits and alternative forms of therapy were described and informed consent was obtained.   The anorectum was pre-medicated with topical lidocaine (5%) and nitroglycerin (0.125%) The decision was made to band the LL internal hemorrhoid, and the [facility] O'Regan System was used to perform band ligation without complication.  Digital anorectal examination was then performed to assure proper positioning of the band, and to adjust the banded tissue as required.  The patient was discharged home without pain or other issues.  Dietary and behavioral recommendations were given and along with follow-up instructions.     The following adjunctive treatments were recommended:  Fiber supplementation Adequate water intake Avoidance of straining and hard stools  The patient will return in 2-4 weeks for  follow-up and possible additional banding as required. No complications were encountered and the patient tolerated the procedure well.

## 2022-02-05 ENCOUNTER — Ambulatory Visit: Payer: BC Managed Care – PPO | Admitting: Gastroenterology

## 2022-02-23 ENCOUNTER — Encounter: Payer: Self-pay | Admitting: Gastroenterology

## 2022-02-23 ENCOUNTER — Ambulatory Visit: Payer: BC Managed Care – PPO | Admitting: Gastroenterology

## 2022-02-23 VITALS — BP 116/72 | HR 91 | Ht 67.0 in | Wt 184.5 lb

## 2022-02-23 DIAGNOSIS — K642 Third degree hemorrhoids: Secondary | ICD-10-CM | POA: Diagnosis not present

## 2022-02-23 NOTE — Patient Instructions (Addendum)
_______________________________________________________  If you are age 42 or older, your body mass index should be between 23-30. Your Body mass index is 28.9 kg/m. If this is out of the aforementioned range listed, please consider follow up with your Primary Care Provider.  If you are age 3 or younger, your body mass index should be between 19-25. Your Body mass index is 28.9 kg/m. If this is out of the aformentioned range listed, please consider follow up with your Primary Care Provider.   HEMORRHOID BANDING PROCEDURE    FOLLOW-UP CARE   The procedure you have had should have been relatively painless since the banding of the area involved does not have nerve endings and there is no pain sensation.  The rubber band cuts off the blood supply to the hemorrhoid and the band may fall off as soon as 48 hours after the banding (the band may occasionally be seen in the toilet bowl following a bowel movement). You may notice a temporary feeling of fullness in the rectum which should respond adequately to plain Tylenol or Motrin.  Following the banding, avoid strenuous exercise that evening and resume full activity the next day.  A sitz bath (soaking in a warm tub) or bidet is soothing, and can be useful for cleansing the area after bowel movements.     To avoid constipation, take two tablespoons of natural wheat bran, natural oat bran, flax, Benefiber or any over the counter fiber supplement and increase your water intake to 7-8 glasses daily.    Unless you have been prescribed anorectal medication, do not put anything inside your rectum for two weeks: No suppositories, enemas, fingers, etc.  Occasionally, you may have more bleeding than usual after the banding procedure.  This is often from the untreated hemorrhoids rather than the treated one.  Don't be concerned if there is a tablespoon or so of blood.  If there is more blood than this, lie flat with your bottom higher than your head and  apply an ice pack to the area. If the bleeding does not stop within a half an hour or if you feel faint, call our office at [phone] or go to the emergency room.  Problems are not common; however, if there is a substantial amount of bleeding, severe pain, chills, fever or difficulty passing urine (very rare) or other problems, you should call us at [phone] or report to the nearest emergency room.  Do not stay seated continuously for more than 2-3 hours for a day or two after the procedure.  Tighten your buttock muscles 10-15 times every two hours and take 10-15 deep breaths every 1-2 hours.  Do not spend more than a few minutes on the toilet if you cannot empty your bowel; instead re-visit the toilet at a later time.    The [facility] providers would like to encourage you to use [facility] to communicate with providers for non-urgent requests or questions.  Due to long hold times on the telephone, sending your provider a message by [facility] may be a faster and more efficient way to get a response.  Please allow 48 business hours for a response.  Please remember that this is for non-urgent requests.   It was a pleasure to see you today!  Thank you for trusting me with your gastrointestinal care!    Scott E.Tomasa Rand, MD

## 2022-02-23 NOTE — Progress Notes (Unsigned)
PROCEDURE NOTE: The patient presents with symptomatic grade 3  hemorrhoids, requesting rubber band ligation of her hemorrhoidal disease.  All risks, benefits and alternative forms of therapy were described and informed consent was obtained.  The patient reports she tolerated the first banding session well.  She reports improvement in some of her hemorrhoid symptoms, namely the seepage/discharge.  Examination revealed a prolapsed hemorrhoid.  The hemorrhoid was reduced manually.  It was difficult to assess which column the prolapsed hemorrhoid was arising from. The anorectum was pre-medicated with topical lidocaine (5%) and nitroglycerin (0.125%) An anoscope was used to better visualize the hemorrhoids, and the most prominent hemorrhoid appeared to be arising from the left lateral column again. The decision was made to band the left lateral internal hemorrhoid, and the [facility] O'Regan System was used to perform band ligation without complication.  Digital anorectal examination was then performed to assure proper positioning of the band, and to adjust the banded tissue as required.  The patient was discharged home without pain or other issues.  Dietary and behavioral recommendations were given and along with follow-up instructions.     The following adjunctive treatments were recommended:  Fiber supplementation Adequate water intake Avoidance of straining and hard stools  The patient will return in 2-4 weeks for  follow-up and possible additional banding as required. No complications were encountered and the patient tolerated the procedure well.

## 2022-02-24 ENCOUNTER — Ambulatory Visit
Admission: EM | Admit: 2022-02-24 | Discharge: 2022-02-24 | Disposition: A | Discharge status: Home / Self Care | Payer: BC Managed Care – PPO | Attending: Internal Medicine | Admitting: Internal Medicine

## 2022-02-24 ENCOUNTER — Other Ambulatory Visit: Payer: Self-pay

## 2022-02-24 ENCOUNTER — Encounter: Payer: Self-pay | Admitting: Emergency Medicine

## 2022-02-24 DIAGNOSIS — N3001 Acute cystitis with hematuria: Secondary | ICD-10-CM | POA: Insufficient documentation

## 2022-02-24 DIAGNOSIS — R300 Dysuria: Secondary | ICD-10-CM | POA: Insufficient documentation

## 2022-02-24 DIAGNOSIS — R350 Frequency of micturition: Secondary | ICD-10-CM | POA: Diagnosis not present

## 2022-02-24 LAB — POCT URINALYSIS DIP (MANUAL ENTRY)
Bilirubin, UA: NEGATIVE
Glucose, UA: 100 mg/dL — AB
Ketones, POC UA: NEGATIVE mg/dL
Nitrite, UA: POSITIVE — AB
Spec Grav, UA: <=1.005 — AB (ref 1.010–1.025)
Urobilinogen, UA: 1 E.U./dL
pH, UA: 5 (ref 5.0–8.0)

## 2022-02-24 MED ORDER — FLUCONAZOLE 150 MG PO TABS: 150.0000 mg | ORAL_TABLET | Freq: Every day | ORAL | 0 refills | Status: DC

## 2022-02-24 MED ORDER — NITROFURANTOIN MONOHYD MACRO 100 MG PO CAPS: 100.0000 mg | ORAL_CAPSULE | Freq: Two times a day (BID) | ORAL | 0 refills | Status: DC

## 2022-02-24 NOTE — Discharge Instructions (Signed)
You have a urinary tract infection which is being treated with antibiotic.  Urine culture is pending.  We will call if it is positive.  Please follow-up if symptoms persist or worsen.

## 2022-02-24 NOTE — ED Provider Notes (Signed)
[name]    CSN: [id number] Arrival date & time: 02/24/22  1755      History   Chief Complaint Chief Complaint  Patient presents with   Dysuria    HPI Roberta Stewart is a 41 y.o. female.   Patient presents with urinary burning, urinary frequency, chills that have been present for about 1 week.  Denies abdominal pain, diarrhea, vaginal discharge or hematuria.  Denies history of recurrent urinary tract infections.  Patient has taken Azo for symptoms with minimal improvement.   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      Home Medications    Prior to Admission medications   Medication Sig Start Date End Date Taking? Authorizing Provider  fluconazole (DIFLUCAN) 150 MG tablet Take 1 tablet (150 mg total) by mouth daily. Take at first sign of vaginal yeast 02/24/22  Yes Summers Buendia, Hawarden E, FNP  nitrofurantoin, macrocrystal-monohydrate, (MACROBID) 100 MG capsule Take 1 capsule (100 mg total) by mouth 2 (two) times daily. 02/24/22  Yes Braelynne Garinger, Acie Fredrickson, FNP  Clobetasol Prop Emollient Base 0.05 % emollient cream Apply 1 application topically 2 (two) times daily as needed.    [provider]  EPINEPHrine 0.3 mg/0.3 mL IJ SOAJ injection Inject 3 mLs into the muscle See admin instructions.    [provider]  Fluticasone Propionate (XHANCE) 93 MCG/ACT EXHU Place 2 sprays into the nose 2 (two) times daily.  03/29/18   Bobbitt, Heywood Iles, MD  Misc Natural Products (ELDERBERRY IMMUNE COMPLEX) CHEW Chew 1 tablet by mouth See admin instructions.    [provider]  valACYclovir (VALTREX) 1000 MG tablet Take 1 tablet by mouth daily as needed. 06/24/18   [provider]    Family History Family History  Problem Relation Age of Onset   Hypertension Mother    Thyroid disease Mother    Heart disease Maternal Grandmother    Diabetes Maternal Grandfather    Diabetes Paternal Grandfather    Colon cancer Neg Hx    Stomach cancer Neg Hx    Esophageal cancer Neg Hx     Social History Social History   Tobacco Use   Smoking status: Never   Smokeless tobacco: Never  Vaping Use   Vaping Use: Never used  Substance Use Topics   Alcohol use: Yes    Alcohol/week: 2.0 standard drinks of alcohol    Types: 2 Glasses of wine per week   Drug use: No     Allergies   Keflex [cephalexin], Yellow jacket venom, Bee venom, Darvon [propoxyphene], and Lidocaine   Review of Systems Review of Systems Per HPI  Physical Exam Triage Vital Signs ED Triage Vitals [02/24/22 1816]  Enc Vitals Group     BP 131/89     Pulse Rate 86     Resp 18  Temp 98 F (36.7 C)     Temp Source Oral     SpO2 96 %     Weight      Height      Head Circumference      Peak Flow      Pain Score 3     Pain Loc      Pain Edu?      Excl. in GC?    No data found.  Updated Vital Signs BP 131/89 (BP Location: Left Arm)   Pulse 86   Temp 98 F (36.7 C) (Oral)   Resp 18   LMP 02/08/2022   SpO2 96%   Visual Acuity Right Eye Distance:   Left Eye Distance:   Bilateral Distance:    Right Eye Near:   Left Eye Near:    Bilateral Near:     Physical Exam Constitutional:      General: She is not in acute distress.    Appearance: Normal appearance. She is not toxic-appearing or diaphoretic.  HENT:     Head: Normocephalic and atraumatic.  Eyes:     Extraocular Movements: Extraocular movements  intact.     Conjunctiva/sclera: Conjunctivae normal.  Cardiovascular:     Rate and Rhythm: Normal rate and regular rhythm.     Pulses: Normal pulses.     Heart sounds: Normal heart sounds.  Pulmonary:     Effort: Pulmonary effort is normal. No respiratory distress.     Breath sounds: Normal breath sounds.  Abdominal:     General: Bowel sounds are normal. There is no distension.     Palpations: Abdomen is soft.     Tenderness: There is no abdominal tenderness.  Neurological:     General: No focal deficit present.     Mental Status: She is alert and oriented to person, place, and time. Mental status is at baseline.  Psychiatric:        Mood and Affect: Mood normal.        Behavior: Behavior normal.        Thought Content: Thought content normal.        Judgment: Judgment normal.      UC Treatments / Results  Labs (all labs ordered are listed, but only abnormal results are displayed) Labs Reviewed  POCT URINALYSIS DIP (MANUAL ENTRY) - Abnormal; Notable for the following components:      Result Value   Color, UA orange (*)    Glucose, UA =100 (*)    Spec Grav, UA <=1.005 (*)    Blood, UA small (*)    Protein Ur, POC trace (*)    Nitrite, UA Positive (*)    Leukocytes, UA Large (3+) (*)    All other components within normal limits  URINE CULTURE    EKG   Radiology No results found.  Procedures Procedures (including critical care time)  Medications Ordered in UC Medications - No data to display  Initial Impression / Assessment and Plan / UC Course  I have reviewed the triage vital signs and the nursing notes.  Pertinent labs & imaging results that were available during my care of the patient were reviewed by me and considered in my medical decision making (see chart for details).    UA indicating possible urinary tract infection.  Although, patient has taken Azo so contamination is possible.  Will opt to treat with Macrobid given associated symptoms.  Urine  culture pending.  Patient advised to follow-up if symptoms persist or worsen.  Patient verbalized understanding and was agreeable with plan. Final Clinical Impressions(s) / UC Diagnoses   Final diagnoses:  Acute cystitis with hematuria  Dysuria  Urinary frequency     Discharge Instructions      You have a urinary tract infection which is being treated with antibiotic.  Urine culture is pending.  We will call if it is positive.  Please follow-up if symptoms persist or worsen.    ED Prescriptions     Medication Sig Dispense Auth. Provider   nitrofurantoin, macrocrystal-monohydrate, (MACROBID) 100 MG capsule Take 1 capsule (100 mg total) by mouth 2 (two) times daily. 10 capsule Ervin Knack E, [place]   fluconazole (DIFLUCAN) 150 MG tablet Take 1 tablet (150 mg total) by mouth daily. Take at first sign of vaginal yeast 1 tablet [place], Acie Fredrickson, [place]      PDMP not reviewed this encounter.   Gustavus Bryant, [place] 02/24/22 [phone]

## 2022-02-24 NOTE — ED Triage Notes (Signed)
Pt sts dysuria and chills x 1 week; pt sts took AZO today

## 2022-02-27 ENCOUNTER — Telehealth: Payer: Self-pay | Admitting: Gastroenterology

## 2022-02-27 DIAGNOSIS — R3121 Asymptomatic microscopic hematuria: Secondary | ICD-10-CM | POA: Diagnosis not present

## 2022-02-27 DIAGNOSIS — R350 Frequency of micturition: Secondary | ICD-10-CM | POA: Diagnosis not present

## 2022-02-27 LAB — URINE CULTURE: Culture: >=100000 — AB

## 2022-02-27 NOTE — Telephone Encounter (Signed)
Patient came into the office today stating that since she had her hemorrhoid banding on Monday that she has experienced chills and when she has a BM she has pain radiating down her left leg.  She did some better taking Tylenol, but she is a bit leary to have a BM. Please call patient and advise.

## 2022-02-27 NOTE — Telephone Encounter (Signed)
Please see note below. 

## 2022-03-03 ENCOUNTER — Telehealth: Payer: Self-pay

## 2022-03-03 NOTE — Telephone Encounter (Signed)
-----   Message from Jenel Lucks, MD sent at 02/27/2022  5:33 PM EDT ----- Bonita Quin, Can you please call Ms. Warf on Monday and see how her symptoms are doing? Thanks

## 2022-03-03 NOTE — Telephone Encounter (Signed)
Spoke with pt and she reports that she is much, much better! Dr. Tomasa Rand notified. She thanked [place] for calling.

## 2022-03-12 DIAGNOSIS — Z23 Encounter for immunization: Secondary | ICD-10-CM | POA: Diagnosis not present

## 2022-03-25 DIAGNOSIS — S70369A Insect bite (nonvenomous), unspecified thigh, initial encounter: Secondary | ICD-10-CM | POA: Diagnosis not present

## 2022-03-25 DIAGNOSIS — J029 Acute pharyngitis, unspecified: Secondary | ICD-10-CM | POA: Diagnosis not present

## 2022-03-25 DIAGNOSIS — R350 Frequency of micturition: Secondary | ICD-10-CM | POA: Diagnosis not present

## 2022-03-31 ENCOUNTER — Ambulatory Visit: Payer: BC Managed Care – PPO | Admitting: Gastroenterology

## 2022-03-31 ENCOUNTER — Encounter: Payer: Self-pay | Admitting: Gastroenterology

## 2022-03-31 VITALS — BP 104/72 | HR 93 | Ht 67.0 in | Wt 185.2 lb

## 2022-03-31 DIAGNOSIS — K642 Third degree hemorrhoids: Secondary | ICD-10-CM | POA: Diagnosis not present

## 2022-03-31 NOTE — Patient Instructions (Signed)
If you are age 42 or older, your body mass index should be between 23-30. Your Body mass index is 29.01 kg/m. If this is out of the aforementioned range listed, please consider follow up with your Primary Care Provider.  If you are age 82 or younger, your body mass index should be between 19-25. Your Body mass index is 29.01 kg/m. If this is out of the aformentioned range listed, please consider follow up with your Primary Care Provider.   Use Anusol suppository starting 2 weeks after banding done today as needed.  HEMORRHOID BANDING PROCEDURE    FOLLOW-UP CARE   The procedure you have had should have been relatively painless since the banding of the area involved does not have nerve endings and there is no pain sensation.  The rubber band cuts off the blood supply to the hemorrhoid and the band may fall off as soon as 48 hours after the banding (the band may occasionally be seen in the toilet bowl following a bowel movement). You may notice a temporary feeling of fullness in the rectum which should respond adequately to plain Tylenol or Motrin.  Following the banding, avoid strenuous exercise that evening and resume full activity the next day.  A sitz bath (soaking in a warm tub) or bidet is soothing, and can be useful for cleansing the area after bowel movements.     To avoid constipation, take two tablespoons of natural wheat bran, natural oat bran, flax, Benefiber or any over the counter fiber supplement and increase your water intake to 7-8 glasses daily.    Unless you have been prescribed anorectal medication, do not put anything inside your rectum for two weeks: No suppositories, enemas, fingers, etc.  Occasionally, you may have more bleeding than usual after the banding procedure.  This is often from the untreated hemorrhoids rather than the treated one.  Don't be concerned if there is a tablespoon or so of blood.  If there is more blood than this, lie flat with your bottom higher  than your head and apply an ice pack to the area. If the bleeding does not stop within a half an hour or if you feel faint, call our office at [phone] or go to the emergency room.  Problems are not common; however, if there is a substantial amount of bleeding, severe pain, chills, fever or difficulty passing urine (very rare) or other problems, you should call us at [phone] or report to the nearest emergency room.  Do not stay seated continuously for more than 2-3 hours for a day or two after the procedure.  Tighten your buttock muscles 10-15 times every two hours and take 10-15 deep breaths every 1-2 hours.  Do not spend more than a few minutes on the toilet if you cannot empty your bowel; instead re-visit the toilet at a later time.      The [place] [facility] providers would like to encourage you to use [facility] to communicate with providers for non-urgent requests or questions.  Due to long hold times on the telephone, sending your provider a message by [facility] may be a faster and more efficient way to get a response.  Please allow 48 business hours for a response.  Please remember that this is for non-urgent requests.   It was a pleasure to see you today!  Thank you for trusting me with your gastrointestinal care!    Scott E.Candis Schatz, MD

## 2022-03-31 NOTE — Progress Notes (Signed)
PROCEDURE NOTE: The patient presents with symptomatic grade 3  hemorrhoids, requesting rubber band ligation of his/her hemorrhoidal disease.  All risks, benefits and alternative forms of therapy were described and informed consent was obtained.  This is the third banding session for Ms. Gaiser.  The left lateral hemorrhoid column was banded twice previously.  Today, the patient reports ongoing symptoms of thin, reddish fluid.  The mucus drainage she had been having is improved.  She had perirectal discomfort that radiated into her leg last time, which eventually resolved after a few days.   The anorectum was pre-medicated with topical lidocaine (5%) and nitroglycerin (0.125%) Exam revealed a prolapsed hemorrhoid that was reduced manually. Digital exam revealed a palpable mobile elongated lesion that seemed to originate from the right posterior column. Anoscopy was performed and showed what appeared to be an elongated hemorrhoid with slight ulceration of the apex arising from the RP column. The decision was made to band the right posterior internal hemorrhoid, and the [place] was used to perform band ligation without complication.  Digital anorectal examination was then performed to assure proper positioning of the band, and to adjust the banded tissue as required.  The patient was discharged home without pain or other issues.  Dietary and behavioral recommendations were given and along with follow-up instructions.     The following adjunctive treatments were recommended:  Fiber supplementation Adequate water intake Avoidance of straining and hard stools The patient was advised to try Anusol suppositories 2 weeks after banding to see if this may help reduce her symptoms as well.  The patient will return in 2-4 weeks for  follow-up and possible additional banding as required. No complications were encountered and the patient tolerated the procedure well.

## 2022-05-13 ENCOUNTER — Encounter: Payer: BC Managed Care – PPO | Admitting: Gastroenterology

## 2022-05-18 ENCOUNTER — Encounter: Payer: Self-pay | Admitting: Gastroenterology

## 2022-05-18 ENCOUNTER — Ambulatory Visit: Payer: BC Managed Care – PPO | Admitting: Gastroenterology

## 2022-05-18 VITALS — BP 110/80 | HR 73 | Ht 67.0 in | Wt 187.5 lb

## 2022-05-18 DIAGNOSIS — K642 Third degree hemorrhoids: Secondary | ICD-10-CM

## 2022-05-18 NOTE — Patient Instructions (Signed)
If you are age 42 or older, your body mass index should be between 23-30. Your Body mass index is 29.37 kg/m. If this is out of the aforementioned range listed, please consider follow up with your Primary Care Provider.  If you are age 59 or younger, your body mass index should be between 19-25. Your Body mass index is 29.37 kg/m. If this is out of the aformentioned range listed, please consider follow up with your Primary Care Provider.   HEMORRHOID BANDING PROCEDURE    FOLLOW-UP CARE   The procedure you have had should have been relatively painless since the banding of the area involved does not have nerve endings and there is no pain sensation.  The rubber band cuts off the blood supply to the hemorrhoid and the band may fall off as soon as 48 hours after the banding (the band may occasionally be seen in the toilet bowl following a bowel movement). You may notice a temporary feeling of fullness in the rectum which should respond adequately to plain Tylenol or Motrin.  Following the banding, avoid strenuous exercise that evening and resume full activity the next day.  A sitz bath (soaking in a warm tub) or bidet is soothing, and can be useful for cleansing the area after bowel movements.     To avoid constipation, take two tablespoons of natural wheat bran, natural oat bran, flax, Benefiber or any over the counter fiber supplement and increase your water intake to 7-8 glasses daily.    Unless you have been prescribed anorectal medication, do not put anything inside your rectum for two weeks: No suppositories, enemas, fingers, etc.  Occasionally, you may have more bleeding than usual after the banding procedure.  This is often from the untreated hemorrhoids rather than the treated one.  Don't be concerned if there is a tablespoon or so of blood.  If there is more blood than this, lie flat with your bottom higher than your head and apply an ice pack to the area. If the bleeding does not stop  within a half an hour or if you feel faint, call our office at [phone] or go to the emergency room.  Problems are not common; however, if there is a substantial amount of bleeding, severe pain, chills, fever or difficulty passing urine (very rare) or other problems, you should call us at [phone] or report to the nearest emergency room.  Do not stay seated continuously for more than 2-3 hours for a day or two after the procedure.  Tighten your buttock muscles 10-15 times every two hours and take 10-15 deep breaths every 1-2 hours.  Do not spend more than a few minutes on the toilet if you cannot empty your bowel; instead re-visit the toilet at a later time.    The [place] [facility] providers would like to encourage you to use [facility] to communicate with providers for non-urgent requests or questions.  Due to long hold times on the telephone, sending your provider a message by [facility] may be a faster and more efficient way to get a response.  Please allow 48 business hours for a response.  Please remember that this is for non-urgent requests.   It was a pleasure to see you today!  Thank you for trusting me with your gastrointestinal care!    Scott E.Tomasa Rand, MD

## 2022-05-18 NOTE — Progress Notes (Unsigned)
No discharge or odor for a while.  Recently started again in the past week or so because of constipation.     PROCEDURE NOTE: The patient presents with symptomatic grade ***  hemorrhoids, requesting rubber band ligation of his/her hemorrhoidal disease.  All risks, benefits and alternative forms of therapy were described and informed consent was obtained.   The anorectum was pre-medicated with topical lidocaine (5%) and nitroglycerin (0.125%) The decision was made to band the *** internal hemorrhoid, and the [facility] was used to perform band ligation without complication.  Digital anorectal examination was then performed to assure proper positioning of the band, and to adjust the banded tissue as required.  The patient was discharged home without pain or other issues.  Dietary and behavioral recommendations were given and along with follow-up instructions.     The following adjunctive treatments were recommended:  Fiber supplementation Adequate water intake Avoidance of straining and hard stools  The patient will return in 2-4 weeks for  follow-up and possible additional banding as required. No complications were encountered and the patient tolerated the procedure well.

## 2022-08-05 ENCOUNTER — Encounter: Payer: Self-pay | Admitting: Gastroenterology

## 2022-08-05 ENCOUNTER — Ambulatory Visit (INDEPENDENT_AMBULATORY_CARE_PROVIDER_SITE_OTHER): Payer: BC Managed Care – PPO | Admitting: Gastroenterology

## 2022-08-05 VITALS — BP 110/72 | HR 79 | Ht 67.0 in | Wt 190.0 lb

## 2022-08-05 DIAGNOSIS — K642 Third degree hemorrhoids: Secondary | ICD-10-CM | POA: Diagnosis not present

## 2022-08-05 NOTE — Progress Notes (Unsigned)
HPI : Roberta Stewart is a very pleasant 42 year old female who I been following for symptomatic hemorrhoids.  She has undergone 4 banding sessions since late June of this year.  The left lateral column was banded twice in the right posterior column was banded twice, most recently November 13. Today, she reports that her hemorrhoid symptoms are "90% improved".  Symptoms 90% improved.  Still has a small amount of prolapse with occasional foul smelling seepage.   Optimal bowel habits.  On toilet less than 5 min No straining, constipation, diarrhea No blood.   Nov 13:  RP Sept 26:  RP Aug 21:  LL June 27:  LL  Past Medical History:  Diagnosis Date   Allergy    Anemia    Angio-edema    Arthritis    GERD (gastroesophageal reflux disease)    Recurrent upper respiratory infection (URI)    Urticaria      Past Surgical History:  Procedure Laterality Date   FOOT SURGERY     TUBAL LIGATION     Family History  Problem Relation Age of Onset   Hypertension Mother    Thyroid disease Mother    Heart disease Maternal Grandmother    Diabetes Maternal Grandfather    Diabetes Paternal Grandfather    Colon cancer Neg Hx    Stomach cancer Neg Hx    Esophageal cancer Neg Hx    Social History   Tobacco Use   Smoking status: Never   Smokeless tobacco: Never  Vaping Use   Vaping Use: Never used  Substance Use Topics   Alcohol use: Yes    Alcohol/week: 2.0 standard drinks of alcohol    Types: 2 Glasses of wine per week   Drug use: No   Current Outpatient Medications  Medication Sig Dispense Refill   Clobetasol Prop Emollient Base 0.05 % emollient cream Apply 1 application topically 2 (two) times daily as needed.     EPINEPHrine 0.3 mg/0.3 mL IJ SOAJ injection Inject 3 mLs into the muscle See admin instructions.     Fluticasone Propionate (XHANCE) 93 MCG/ACT EXHU Place 2 sprays into the nose 2 (two) times daily. 16 mL 5   Misc Natural Products (ELDERBERRY IMMUNE COMPLEX) CHEW  Chew 1 tablet by mouth See admin instructions.     valACYclovir (VALTREX) 1000 MG tablet Take 1 tablet by mouth daily as needed.     No current facility-administered medications for this visit.   Allergies  Allergen Reactions   Keflex [Cephalexin] Swelling   Yellow Jacket Venom Anaphylaxis    Yellow Jackets and Wasps   Bee Venom Other (See Comments)   Darvon [Propoxyphene] Nausea And Vomiting    darvocet   Lidocaine Other (See Comments)     Review of Systems: All systems reviewed and negative except where noted in HPI.    No results found.  Physical Exam: BP 110/72   Pulse 79   Ht 5\' 7"  (1.702 m)   Wt 190 lb (86.2 kg)   BMI 29.76 kg/m  Constitutional: Pleasant,well-developed, ***female in no acute distress. HEENT: Normocephalic and atraumatic. Conjunctivae are normal. No scleral icterus. Neck supple.  Cardiovascular: Normal rate, regular rhythm.  Pulmonary/chest: Effort normal and breath sounds normal. No wheezing, rales or rhonchi. Abdominal: Soft, nondistended, nontender. Bowel sounds active throughout. There are no masses palpable. No hepatomegaly. Extremities: no edema Lymphadenopathy: No cervical adenopathy noted. Neurological: Alert and oriented to person place and time. Skin: Skin is warm and dry. No rashes noted.  Psychiatric: Normal mood and affect. Behavior is normal.  CBC    Component Value Date/Time   WBC 9.5 02/25/2018 0830   RBC 5.36 (H) 02/25/2018 0830   HGB 13.8 02/25/2018 0830   HCT 42.3 02/25/2018 0830   PLT 250 02/25/2018 0830   MCV 78.9 02/25/2018 0830   MCH 25.7 (L) 02/25/2018 0830   MCHC 32.6 02/25/2018 0830   RDW 13.5 02/25/2018 0830   LYMPHSABS 1.2 02/25/2018 0830   MONOABS 0.4 02/25/2018 0830   EOSABS 0.1 02/25/2018 0830   BASOSABS 0.0 02/25/2018 0830    CMP     Component Value Date/Time   NA 137 02/25/2018 0830   K 3.9 02/25/2018 0830   CL 105 02/25/2018 0830   CO2 21 (L) 02/25/2018 0830   GLUCOSE 105 (H) 02/25/2018 0830    BUN 14 02/25/2018 0830   CREATININE 1.00 02/25/2018 0830   CALCIUM 9.2 02/25/2018 0830   PROT 7.0 02/25/2018 0830   ALBUMIN 4.0 02/25/2018 0830   AST 17 02/25/2018 0830   ALT 14 02/25/2018 0830   ALKPHOS 41 02/25/2018 0830   BILITOT 1.3 (H) 02/25/2018 0830   GFRNONAA >60 02/25/2018 0830   GFRAA >60 02/25/2018 0830     ASSESSMENT AND PLAN:   Third degree hemorrhoids - Improved with banding x 4; still some mild prolapse symptoms - Elected to hold off on further banding for now. - Recommended hydrocortisone suppositories as needed   Seward Carol, MD

## 2022-08-05 NOTE — Patient Instructions (Signed)
If you are age 43 or older, your body mass index should be between 23-30. Your Body mass index is 29.76 kg/m. If this is out of the aforementioned range listed, please consider follow up with your Primary Care Provider.  If you are age 71 or younger, your body mass index should be between 19-25. Your Body mass index is 29.76 kg/m. If this is out of the aformentioned range listed, please consider follow up with your Primary Care Provider.   Please try over the counter Preparation H suppositories as needed.   Continue fiber supplements.  Follow up as needed.  The [place] [facility] providers would like to encourage you to use [facility] to communicate with providers for non-urgent requests or questions.  Due to long hold times on the telephone, sending your provider a message by [facility] may be a faster and more efficient way to get a response.  Please allow 48 business hours for a response.  Please remember that this is for non-urgent requests.   It was a pleasure to see you today!  Thank you for trusting me with your gastrointestinal care!    Scott E.Candis Schatz

## 2022-09-18 ENCOUNTER — Encounter: Payer: Self-pay | Admitting: Emergency Medicine

## 2022-09-18 ENCOUNTER — Ambulatory Visit
Admission: EM | Admit: 2022-09-18 | Discharge: 2022-09-18 | Disposition: A | Discharge status: Home / Self Care | Payer: BC Managed Care – PPO | Attending: Family Medicine | Admitting: Family Medicine

## 2022-09-18 DIAGNOSIS — N3090 Cystitis, unspecified without hematuria: Secondary | ICD-10-CM | POA: Diagnosis not present

## 2022-09-18 LAB — POCT URINALYSIS DIP (MANUAL ENTRY)
Bilirubin, UA: NEGATIVE
Glucose, UA: NEGATIVE mg/dL
Ketones, POC UA: NEGATIVE mg/dL
Nitrite, UA: NEGATIVE
Spec Grav, UA: >=1.03 — AB (ref 1.010–1.025)
Urobilinogen, UA: 0.2 E.U./dL
pH, UA: 5.5 (ref 5.0–8.0)

## 2022-09-18 MED ORDER — PHENAZOPYRIDINE HCL 100 MG PO TABS: 100.0000 mg | ORAL_TABLET | Freq: Three times a day (TID) | ORAL | 0 refills | Status: AC | PRN

## 2022-09-18 MED ORDER — NITROFURANTOIN MONOHYD MACRO 100 MG PO CAPS
100.0000 mg | ORAL_CAPSULE | Freq: Two times a day (BID) | ORAL | 0 refills | Status: DC
Start: 2022-09-18 — End: 2023-07-09

## 2022-09-18 NOTE — ED Triage Notes (Signed)
Noticed urinary frequency over the last week. Reports new dysuria and the urge to go, as well as feelings of incomplete bladder emptying. Reports new lower back pain. Denies fever, chills, lower abdominal pain.

## 2022-09-18 NOTE — Discharge Instructions (Signed)
The urinalysis had some white blood cells and a little bit of blood.  This could be a sign of some bladder infection.  Take nitrofurantoin 100 mg--1 capsule 2 times daily for 5 days  Take Pyridium 100 mg--1 tablet 3 times daily as needed for urinary pain.  This medication usually makes the urine orange  Make sure you drink enough water  Staff will notify you if it looks like the antibiotic needs to be changed.  A urine culture was sent

## 2022-09-18 NOTE — ED Provider Notes (Signed)
[name]    CSN: [id number] Arrival date & time: 09/18/22  1743      History   Chief Complaint Chief Complaint  Patient presents with   Urinary Frequency    HPI Roberta Stewart is a 42 y.o. female.    Urinary Frequency   Here for urinary frequency and urinary pressure and urge.  Symptoms began 2 to 3 days ago.  No fever and no vomiting.  She has had a little bit of low back pain.  Last menstrual cycle was about 2 weeks ago.  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      Home Medications    Prior to Admission medications   Medication Sig Start Date End Date Taking? Authorizing Provider  nitrofurantoin, macrocrystal-monohydrate, (MACROBID) 100 MG capsule Take 1 capsule (100 mg total) by mouth 2 (two) times daily. 09/18/22  Yes Barrett Henle, MD  phenazopyridine (PYRIDIUM) 100 MG tablet Take 1 tablet (100 mg total) by mouth 3 (three) times daily as needed (urinary pain). 09/18/22  Yes Barrett Henle, MD  Clobetasol Prop Emollient Base 0.05 % emollient cream Apply 1 application topically 2 (two) times daily as needed.    [provider]  EPINEPHrine 0.3 mg/0.3 mL IJ SOAJ injection Inject 3 mLs into the muscle See admin instructions.    [provider]  Fluticasone Propionate (XHANCE) 93 MCG/ACT EXHU Place 2 sprays into the nose 2 (two) times daily. 03/29/18   Bobbitt, [place] Muta, MD  Misc  Natural Products (ELDERBERRY IMMUNE COMPLEX) CHEW Chew 1 tablet by mouth See admin instructions.    [provider]  valACYclovir (VALTREX) 1000 MG tablet Take 1 tablet by mouth daily as needed. 06/24/18   [provider]    Family History Family History  Problem Relation Age of Onset   Hypertension Mother    Thyroid disease Mother    Heart disease Maternal Grandmother    Diabetes Maternal Grandfather    Diabetes Paternal Grandfather    Colon cancer Neg Hx    Stomach cancer Neg Hx    Esophageal cancer Neg Hx     Social History Social History   Tobacco Use   Smoking status: Never   Smokeless tobacco: Never  Vaping Use   Vaping Use: Never used  Substance Use Topics   Alcohol use: Yes    Alcohol/week: 2.0 standard drinks of alcohol    Types: 2 Glasses of wine per week   Drug use: No     Allergies   Keflex [cephalexin], Yellow jacket venom, Bee venom, Darvon [propoxyphene], and Lidocaine   Review of Systems Review of Systems  Genitourinary:  Positive for frequency.     Physical Exam Triage Vital Signs ED Triage Vitals [09/18/22 1801]  Enc Vitals Group     BP (!) 145/80  Pulse Rate 74     Resp 16     Temp 98.8 F (37.1 C)     Temp Source Oral     SpO2 99 %     Weight      Height      Head Circumference      Peak Flow      Pain Score 0     Pain Loc      Pain Edu?      Excl. in [place]?    No data found.  Updated Vital Signs BP (!) 145/80 (BP Location: Left Arm)   Pulse 74   Temp 98.8 F (37.1 C) (Oral)   Resp 16   SpO2 99%   Visual Acuity Right Eye Distance:   Left Eye Distance:   Bilateral Distance:    Right Eye Near:   Left Eye Near:    Bilateral Near:     Physical Exam Vitals reviewed.  Constitutional:      General: She is not in acute distress.    Appearance: She is not ill-appearing, toxic-appearing or diaphoretic.  Cardiovascular:     Rate and Rhythm: Normal rate and regular rhythm.  Pulmonary:     Effort:  Pulmonary effort is normal.     Breath sounds: Normal breath sounds.  Abdominal:     Palpations: Abdomen is soft.     Tenderness: There is no abdominal tenderness. There is no right CVA tenderness or left CVA tenderness.  Skin:    Coloration: Skin is not pale.  Neurological:     General: No focal deficit present.     Mental Status: She is alert and oriented to person, place, and time.  Psychiatric:        Behavior: Behavior normal.      UC Treatments / Results  Labs (all labs ordered are listed, but only abnormal results are displayed) Labs Reviewed  POCT URINALYSIS DIP (MANUAL ENTRY) - Abnormal; Notable for the following components:      Result Value   Clarity, UA cloudy (*)    Spec Grav, UA >=1.030 (*)    Blood, UA small (*)    Protein Ur, POC trace (*)    Leukocytes, UA Small (1+) (*)    All other components within normal limits  URINE CULTURE    EKG   Radiology No results found.  Procedures Procedures (including critical care time)  Medications Ordered in UC Medications - No data to display  Initial Impression / Assessment and Plan / UC Course  I have reviewed the triage vital signs and the nursing notes.  Pertinent labs & imaging results that were available during my care of the patient were reviewed by me and considered in my medical decision making (see chart for details).        Urinalysis has some white cells and red cells.  Urine culture is sent, and Macrodantin is sent in for UTI. Final Clinical Impressions(s) / UC Diagnoses   Final diagnoses:  Cystitis     Discharge Instructions      The urinalysis had some white blood cells and a little bit of blood.  This could be a sign of some bladder infection.  Take nitrofurantoin 100 mg--1 capsule 2 times daily for 5 days  Take Pyridium 100 mg--1 tablet 3 times daily as needed for urinary pain.  This medication usually makes the urine orange  Make sure you drink enough water  Staff will notify  you if it looks like the  antibiotic needs to be changed.  A urine culture was sent     ED Prescriptions     Medication Sig Dispense Auth. Provider   nitrofurantoin, macrocrystal-monohydrate, (MACROBID) 100 MG capsule Take 1 capsule (100 mg total) by mouth 2 (two) times daily. 10 capsule Barrett Henle, MD   phenazopyridine (PYRIDIUM) 100 MG tablet Take 1 tablet (100 mg total) by mouth 3 (three) times daily as needed (urinary pain). 10 tablet Windy Carina Gwenlyn Perking, MD      PDMP not reviewed this encounter.   Barrett Henle, MD 09/18/22 [phone]

## 2022-09-21 LAB — URINE CULTURE: Culture: >=100000 — AB

## 2022-10-06 DIAGNOSIS — N951 Menopausal and female climacteric states: Secondary | ICD-10-CM | POA: Diagnosis not present

## 2022-11-05 DIAGNOSIS — Z1231 Encounter for screening mammogram for malignant neoplasm of breast: Secondary | ICD-10-CM | POA: Diagnosis not present

## 2022-12-03 DIAGNOSIS — Z124 Encounter for screening for malignant neoplasm of cervix: Secondary | ICD-10-CM | POA: Diagnosis not present

## 2022-12-03 DIAGNOSIS — Z01419 Encounter for gynecological examination (general) (routine) without abnormal findings: Secondary | ICD-10-CM | POA: Diagnosis not present

## 2022-12-14 DIAGNOSIS — Z0000 Encounter for general adult medical examination without abnormal findings: Secondary | ICD-10-CM | POA: Diagnosis not present

## 2022-12-14 DIAGNOSIS — R7303 Prediabetes: Secondary | ICD-10-CM | POA: Diagnosis not present

## 2022-12-14 DIAGNOSIS — Z13220 Encounter for screening for lipoid disorders: Secondary | ICD-10-CM | POA: Diagnosis not present

## 2022-12-14 DIAGNOSIS — E559 Vitamin D deficiency, unspecified: Secondary | ICD-10-CM | POA: Diagnosis not present

## 2023-02-18 DIAGNOSIS — N3000 Acute cystitis without hematuria: Secondary | ICD-10-CM | POA: Diagnosis not present

## 2023-02-18 DIAGNOSIS — R3121 Asymptomatic microscopic hematuria: Secondary | ICD-10-CM | POA: Diagnosis not present

## 2023-03-02 DIAGNOSIS — D225 Melanocytic nevi of trunk: Secondary | ICD-10-CM | POA: Diagnosis not present

## 2023-03-02 DIAGNOSIS — D485 Neoplasm of uncertain behavior of skin: Secondary | ICD-10-CM | POA: Diagnosis not present

## 2023-03-04 DIAGNOSIS — Z1331 Encounter for screening for depression: Secondary | ICD-10-CM | POA: Diagnosis not present

## 2023-03-04 DIAGNOSIS — K648 Other hemorrhoids: Secondary | ICD-10-CM | POA: Diagnosis not present

## 2023-03-09 ENCOUNTER — Telehealth: Payer: Self-pay | Admitting: Gastroenterology

## 2023-03-09 NOTE — Telephone Encounter (Signed)
Pt wants to know if Dr. Tomasa Rand agrees with the management of hemorrhoid tissue that the surgeon recommended. Please see record below from [facility] (in [place]):  Assessment/Plan:  42 y.o. female with prolapsed internal hemorrhoid tissue - I used diagrams to explain relevant anatomy. She has a right posterior internal hemorrhoid column that is prolapsing out. Is likely some stretched out tissue from how she scarred after her band ligation. I told her that medical management will not resolve this and I recommend excisional hemorrhoidectomy/excision of this lesion. It does not appear malignant to me. We discussed the risks of pain, scar, bleeding, infection. She wishes to proceed.  Gaetana Michaelis, MD  History COLINDA ZYLA is a 42 y.o. year-old female who presents today for evaluation of bleeding hemorrhoid.  She presents for evaluation of a hemorrhoid. She had a colonoscopy in 2023 for bleeding. It was okay other than noticing hemorrhoids. Since then she has had rubber band ligation 4 times with improvement but she feels 1 area keeps prolapsing out. Will bleed frequently. She is a wet wipes to try to get clean. She also feels that lifting heavy objects or working out can make it prolapse and bleed. She denies any constipation or straining.   Please advise.

## 2023-03-09 NOTE — Telephone Encounter (Signed)
Inbound call from patient requesting to speak about her hemorrhoids treatment. Patient states she did see a Careers adviser but they recommend something else for her. States she would like to have Dr. Milas Hock recommendations. Requesting a call back. Please advise, thank you.

## 2023-03-10 NOTE — Telephone Encounter (Signed)
Jenel Lucks, MD  You1 hour ago (7:53 AM)   [place] I think hemorrhoidectomy is the next step in treatment if she is significantly bothered by her hemorrhoid symptoms.  Unlikely further banding would offer much more improvement.    Spoke with pt and she is aware of recommendations per Dr. Tomasa Rand.

## 2023-06-02 DIAGNOSIS — L578 Other skin changes due to chronic exposure to nonionizing radiation: Secondary | ICD-10-CM | POA: Diagnosis not present

## 2023-06-02 DIAGNOSIS — L810 Postinflammatory hyperpigmentation: Secondary | ICD-10-CM | POA: Diagnosis not present

## 2023-06-02 DIAGNOSIS — L814 Other melanin hyperpigmentation: Secondary | ICD-10-CM | POA: Diagnosis not present

## 2023-06-02 DIAGNOSIS — L910 Hypertrophic scar: Secondary | ICD-10-CM | POA: Diagnosis not present

## 2023-06-02 DIAGNOSIS — D229 Melanocytic nevi, unspecified: Secondary | ICD-10-CM | POA: Diagnosis not present

## 2023-06-11 ENCOUNTER — Telehealth: Payer: Self-pay | Admitting: Gastroenterology

## 2023-06-11 NOTE — Telephone Encounter (Signed)
Pt states she was referred to a colorectal surgeon but she would feel more comfortable with a female Careers adviser. Pt requesting to be referred to Dr. Inetta Fermo. Please advise if ok to refer pt.

## 2023-06-11 NOTE — Telephone Encounter (Signed)
Inbound call from patient wanting to see if there is anyway she can get a referral from Dr. Tomasa Rand to Dr. Donnetta Hail who is rectal surgeon.  Please advise  Patient provided fax number as  [phone].

## 2023-06-11 NOTE — Telephone Encounter (Signed)
Referral faxed along with records to Dr. Drue Dun at [phone].

## 2023-06-15 NOTE — Telephone Encounter (Signed)
Roberta Stewart at Dr. Joycelyn Das office is calling to have demographics faxed to them to contact the patient for a referral. Please fax to [phone]

## 2023-06-15 NOTE — Telephone Encounter (Signed)
Faxed as requested

## 2023-07-06 ENCOUNTER — Ambulatory Visit
Admission: RE | Admit: 2023-07-06 | Discharge: 2023-07-06 | Disposition: A | Discharge status: Home / Self Care | Payer: BC Managed Care – PPO | Source: Ambulatory Visit | Attending: Internal Medicine | Admitting: Internal Medicine

## 2023-07-06 ENCOUNTER — Ambulatory Visit
Admission: RE | Admit: 2023-07-06 | Discharge: 2023-07-06 | Disposition: A | Discharge status: Home / Self Care | Payer: BC Managed Care – PPO | Source: Ambulatory Visit

## 2023-07-06 DIAGNOSIS — R300 Dysuria: Secondary | ICD-10-CM | POA: Diagnosis not present

## 2023-07-06 LAB — POCT URINALYSIS DIP (MANUAL ENTRY)
Bilirubin, UA: NEGATIVE
Glucose, UA: NEGATIVE mg/dL
Ketones, POC UA: NEGATIVE mg/dL
Leukocytes, UA: NEGATIVE
Nitrite, UA: NEGATIVE
Protein Ur, POC: NEGATIVE mg/dL
Spec Grav, UA: >=1.03 — AB (ref 1.010–1.025)
Urobilinogen, UA: 0.2 U/dL
pH, UA: 5.5 (ref 5.0–8.0)

## 2023-07-06 NOTE — ED Triage Notes (Signed)
"  I think I have a UTI, I am having Urinary frequency, tender in lower abd area, started about 4 days ago". No fever. Some nausea, no vomiting. No concern for STI.

## 2023-07-09 ENCOUNTER — Telehealth: Payer: Self-pay | Admitting: Physician Assistant

## 2023-07-09 LAB — URINE CULTURE: Culture: >=100000 — AB

## 2023-07-09 MED ORDER — NITROFURANTOIN MONOHYD MACRO 100 MG PO CAPS: 100.0000 mg | ORAL_CAPSULE | Freq: Two times a day (BID) | ORAL | 0 refills | Status: AC

## 2023-07-09 NOTE — Telephone Encounter (Signed)
-----   Message from Asberry JULIANNA Senters sent at 07/09/2023  8:36 AM EST ----- Urine culture positive- antibiotic sent to pharmacy- see phone note. Please notify patient. ----- Message ----- From: Rebecka, [facility] [place] Sent: 07/08/2023   7:40 AM EST To: Asberry JULIANNA Senters, PA-C

## 2023-07-09 NOTE — Telephone Encounter (Signed)
 Notified.   No further questions.  Yancey Flemings CMA

## 2023-07-09 NOTE — Telephone Encounter (Signed)
 Patient with UTI per culture.  Abx sent to pharmacy. Advise follow up if symptoms persist or worsen.

## 2023-07-11 NOTE — ED Provider Notes (Signed)
 [name]    CSN: [id number] Arrival date & time: 07/06/23  1752      History   Chief Complaint Chief Complaint  Patient presents with   UTI Symptoms    HPI Roberta Stewart is a 43 y.o. female.   Patient here today for possible UTI.  She reports that she is having urinary frequency as well as some lower abdominal tenderness.  Symptoms started 4 days ago and have not resolved.  She denies any fever.  She reports some nausea but no vomiting.  She denies any concerns for STDs.  The history is provided by the patient.    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      Home Medications    Prior to Admission medications   Medication Sig Start Date End Date Taking? Authorizing Provider  fluticasone  (FLONASE ) 50 MCG/ACT nasal spray Place 1 spray into both nostrils daily.   Yes [provider]  JUNEL FE 1/20 1-20 MG-MCG tablet Take 1 tablet by mouth daily. 04/10/23  Yes [provider]  JUNEL FE 1/20 1-20 MG-MCG tablet Take 1 tablet by mouth daily. 01/10/23  Yes [provider]  JUNEL FE 1/20 1-20 MG-MCG tablet Take 1 tablet by mouth daily. 07/03/23  Yes [provider]  Misc Natural Products (ELDERBERRY IMMUNE COMPLEX) CHEW Chew 1 tablet by mouth See admin instructions.   Yes [provider]  montelukast (SINGULAIR) 10 MG tablet Take 10 mg by mouth  daily.   Yes [provider]  cetirizine (ZYRTEC) 10 MG tablet Take 10 mg by mouth daily.    [provider]  Clobetasol Prop Emollient Base 0.05 % emollient cream Apply 1 application topically 2 (two) times daily as needed.    [provider]  Clobetasol Prop Emollient Base 0.05 % emollient cream Apply 1 Application topically 2 (two) times daily.    [provider]  EPINEPHrine 0.3 mg/0.3 mL IJ SOAJ injection Inject 3 mLs into the muscle See admin instructions.    [provider]  Fluticasone  Propionate (XHANCE ) 93 MCG/ACT EXHU Place 2 sprays into the nose 2 (two) times daily. 03/29/18   Bobbitt, Elgin Pepper, MD  nitrofurantoin , macrocrystal-monohydrate, (MACROBID ) 100 MG capsule Take 1 capsule (100 mg total) by mouth 2 (two) times daily. 07/09/23   Billy Asberry FALCON, PA-C  phenazopyridine  (PYRIDIUM ) 100 MG tablet Take 1 tablet (100 mg total) by mouth 3 (three) times daily as needed (urinary pain). 09/18/22   Banister, Pamela K, MD  valACYclovir (VALTREX) 1000 MG tablet Take 1 tablet by mouth daily as needed. 06/24/18   [provider]  Vitamin D, Ergocalciferol, (DRISDOL) 1.25 MG (50000 UNIT) CAPS capsule Take 50,000 Units by mouth once a week.    [provider]  Vitamins/Minerals TABS Take by mouth.  [provider]    Family History Family History  Problem Relation Age of Onset   Hypertension Mother    Thyroid  disease Mother    Heart disease Maternal Grandmother    Diabetes Maternal Grandfather    Diabetes Paternal Grandfather    Colon cancer Neg Hx    Stomach cancer Neg Hx    Esophageal cancer Neg Hx     Social History Social History   Tobacco Use   Smoking status: Never   Smokeless tobacco: Never  Vaping Use   Vaping status: Never Used  Substance Use Topics   Alcohol use: Yes    Alcohol/week: 2.0 standard drinks of alcohol    Types: 2 Glasses of wine per week    Comment: Occassionally.   Drug use:  Never     Allergies   Keflex [cephalexin], Yellow jacket venom, Bee venom, Epinephrine, Lidocaine, Methadone, and Propoxyphene   Review of Systems Review of Systems  Constitutional:  Negative for chills and fever.  Eyes:  Negative for discharge and redness.  Gastrointestinal:  Positive for abdominal pain. Negative for nausea and vomiting.  Genitourinary:  Positive for dysuria and frequency. Negative for vaginal discharge.     Physical Exam Triage Vital Signs ED Triage Vitals  Encounter Vitals Group     BP 07/06/23 1835 118/75     Systolic BP Percentile --      Diastolic BP Percentile --      Pulse Rate 07/06/23 1835 86     Resp 07/06/23 1835 18     Temp 07/06/23 1835 97.9 F (36.6 C)     Temp Source 07/06/23 1835 Oral     SpO2 07/06/23 1835 99 %     Weight 07/06/23 1833 180 lb (81.6 kg)     Height 07/06/23 1833 5' 7 (1.702 m)     Head Circumference --      Peak Flow --      Pain Score 07/06/23 1830 1     Pain Loc --      Pain Education --      Exclude from Growth Chart --    No data found.  Updated Vital Signs BP 118/75 (BP Location: Left Arm)   Pulse 86   Temp 97.9 F (36.6 C) (Oral)   Resp 18   Ht 5' 7 (1.702 m)   Wt 180 lb (81.6 kg)   LMP 06/23/2023 (Exact Date)   SpO2 99%   BMI 28.19 kg/m   Visual Acuity Right Eye Distance:   Left Eye Distance:   Bilateral Distance:    Right Eye Near:   Left Eye Near:    Bilateral Near:     Physical Exam Vitals and nursing note reviewed.  Constitutional:      General: She is not in acute distress.    Appearance: Normal appearance. She is not ill-appearing.  HENT:     Head: Normocephalic and atraumatic.  Eyes:     Conjunctiva/sclera: Conjunctivae normal.  Cardiovascular:     Rate and Rhythm: Normal rate.  Pulmonary:     Effort: Pulmonary effort is normal. No respiratory distress.  Neurological:     Mental Status: She is alert.  Psychiatric:        Mood and Affect: Mood normal.        Behavior:  Behavior normal.        Thought Content: Thought content normal.      UC Treatments / Results  Labs (all labs ordered are listed, but  only abnormal results are displayed) Labs Reviewed  URINE CULTURE - Abnormal; Notable for the following components:      Result Value   Culture >=100,000 COLONIES/mL STAPHYLOCOCCUS EPIDERMIDIS (*)    Organism ID, Bacteria STAPHYLOCOCCUS EPIDERMIDIS (*)    All other components within normal limits  POCT URINALYSIS DIP (MANUAL ENTRY) - Abnormal; Notable for the following components:   Spec Grav, UA >=1.030 (*)    Blood, UA trace-intact (*)    All other components within normal limits    EKG   Radiology No results found.  Procedures Procedures (including critical care time)  Medications Ordered in UC Medications - No data to display  Initial Impression / Assessment and Plan / UC Course  I have reviewed the triage vital signs and the nursing notes.  Pertinent labs & imaging results that were available during my care of the patient were reviewed by me and considered in my medical decision making (see chart for details).    UA without signs of UTI at this time.  Will order urine culture.  Will await results of further recommendation for treatment but encouraged follow-up with any worsening symptoms in the meantime.  Patient expresses understanding.  Final Clinical Impressions(s) / UC Diagnoses   Final diagnoses:  Dysuria   Discharge Instructions   None    ED Prescriptions   None    PDMP not reviewed this encounter.   Billy Asberry FALCON, PA-C 07/11/23 0830

## 2023-07-12 ENCOUNTER — Telehealth: Payer: Self-pay | Admitting: Family Medicine

## 2023-07-12 MED ORDER — FLUCONAZOLE 150 MG PO TABS: 150.0000 mg | ORAL_TABLET | ORAL | 0 refills | Status: AC | PRN

## 2023-07-12 MED ORDER — CIPROFLOXACIN HCL 250 MG PO TABS: 250.0000 mg | ORAL_TABLET | Freq: Two times a day (BID) | ORAL | 0 refills | Status: AC

## 2023-07-12 NOTE — Telephone Encounter (Signed)
 Patient has had a reaction to Macrobid , feels that UTI is improving however she is intolerant of the Macrobid  as it is causing nausea, abdominal pain dizziness and confusion.  Advised to discontinue Macrobid  reviewed urine culture and prescribe 3 days of ciprofloxacin .  Patient advised to return if symptoms do not improve.  Diflucan  prescribed for yeast associated with antibiotic use

## 2023-08-03 DIAGNOSIS — Z111 Encounter for screening for respiratory tuberculosis: Secondary | ICD-10-CM | POA: Diagnosis not present

## 2023-08-31 DIAGNOSIS — K648 Other hemorrhoids: Secondary | ICD-10-CM | POA: Diagnosis not present

## 2023-08-31 DIAGNOSIS — K6289 Other specified diseases of anus and rectum: Secondary | ICD-10-CM | POA: Diagnosis not present

## 2023-08-31 DIAGNOSIS — K644 Residual hemorrhoidal skin tags: Secondary | ICD-10-CM | POA: Diagnosis not present

## 2023-10-12 DIAGNOSIS — K6289 Other specified diseases of anus and rectum: Secondary | ICD-10-CM | POA: Diagnosis not present

## 2023-10-12 DIAGNOSIS — K644 Residual hemorrhoidal skin tags: Secondary | ICD-10-CM | POA: Diagnosis not present

## 2023-10-12 DIAGNOSIS — K648 Other hemorrhoids: Secondary | ICD-10-CM | POA: Diagnosis not present

## 2023-11-02 DIAGNOSIS — N390 Urinary tract infection, site not specified: Secondary | ICD-10-CM | POA: Diagnosis not present

## 2023-11-02 DIAGNOSIS — B349 Viral infection, unspecified: Secondary | ICD-10-CM | POA: Diagnosis not present

## 2023-11-02 DIAGNOSIS — R300 Dysuria: Secondary | ICD-10-CM | POA: Diagnosis not present

## 2023-12-15 ENCOUNTER — Other Ambulatory Visit: Payer: Self-pay | Admitting: Obstetrics and Gynecology

## 2023-12-15 DIAGNOSIS — Z1231 Encounter for screening mammogram for malignant neoplasm of breast: Secondary | ICD-10-CM

## 2023-12-29 ENCOUNTER — Ambulatory Visit
Admission: RE | Admit: 2023-12-29 | Discharge: 2023-12-29 | Disposition: A | Discharge status: Home / Self Care | Source: Ambulatory Visit | Attending: Obstetrics and Gynecology | Admitting: Obstetrics and Gynecology

## 2023-12-29 DIAGNOSIS — Z1231 Encounter for screening mammogram for malignant neoplasm of breast: Secondary | ICD-10-CM | POA: Diagnosis not present

## 2024-02-04 DIAGNOSIS — Z01411 Encounter for gynecological examination (general) (routine) with abnormal findings: Secondary | ICD-10-CM | POA: Diagnosis not present

## 2024-02-04 DIAGNOSIS — N898 Other specified noninflammatory disorders of vagina: Secondary | ICD-10-CM | POA: Diagnosis not present

## 2024-02-04 DIAGNOSIS — N920 Excessive and frequent menstruation with regular cycle: Secondary | ICD-10-CM | POA: Diagnosis not present

## 2024-02-04 DIAGNOSIS — N762 Acute vulvitis: Secondary | ICD-10-CM | POA: Diagnosis not present

## 2024-02-04 DIAGNOSIS — Z1331 Encounter for screening for depression: Secondary | ICD-10-CM | POA: Diagnosis not present

## 2024-02-14 DIAGNOSIS — R300 Dysuria: Secondary | ICD-10-CM | POA: Diagnosis not present

## 2024-02-14 DIAGNOSIS — N760 Acute vaginitis: Secondary | ICD-10-CM | POA: Diagnosis not present

## 2024-02-14 DIAGNOSIS — Z118 Encounter for screening for other infectious and parasitic diseases: Secondary | ICD-10-CM | POA: Diagnosis not present

## 2024-02-14 DIAGNOSIS — N898 Other specified noninflammatory disorders of vagina: Secondary | ICD-10-CM | POA: Diagnosis not present

## 2024-02-15 DIAGNOSIS — K648 Other hemorrhoids: Secondary | ICD-10-CM | POA: Diagnosis not present

## 2024-02-15 DIAGNOSIS — K6289 Other specified diseases of anus and rectum: Secondary | ICD-10-CM | POA: Diagnosis not present

## 2024-02-15 DIAGNOSIS — K644 Residual hemorrhoidal skin tags: Secondary | ICD-10-CM | POA: Diagnosis not present

## 2024-02-24 DIAGNOSIS — N130 Hydronephrosis with ureteropelvic junction obstruction: Secondary | ICD-10-CM | POA: Diagnosis not present

## 2024-02-24 DIAGNOSIS — R8271 Bacteriuria: Secondary | ICD-10-CM | POA: Diagnosis not present

## 2024-02-24 DIAGNOSIS — R3121 Asymptomatic microscopic hematuria: Secondary | ICD-10-CM | POA: Diagnosis not present

## 2024-02-24 DIAGNOSIS — N3000 Acute cystitis without hematuria: Secondary | ICD-10-CM | POA: Diagnosis not present

## 2024-02-28 DIAGNOSIS — E559 Vitamin D deficiency, unspecified: Secondary | ICD-10-CM | POA: Diagnosis not present

## 2024-02-28 DIAGNOSIS — R7303 Prediabetes: Secondary | ICD-10-CM | POA: Diagnosis not present

## 2024-02-28 DIAGNOSIS — E663 Overweight: Secondary | ICD-10-CM | POA: Diagnosis not present

## 2024-02-28 DIAGNOSIS — Z0000 Encounter for general adult medical examination without abnormal findings: Secondary | ICD-10-CM | POA: Diagnosis not present

## 2024-03-19 DIAGNOSIS — N760 Acute vaginitis: Secondary | ICD-10-CM | POA: Diagnosis not present

## 2024-03-19 DIAGNOSIS — R350 Frequency of micturition: Secondary | ICD-10-CM | POA: Diagnosis not present

## 2024-04-27 DIAGNOSIS — M2550 Pain in unspecified joint: Secondary | ICD-10-CM | POA: Diagnosis not present

## 2024-04-27 DIAGNOSIS — M79603 Pain in arm, unspecified: Secondary | ICD-10-CM | POA: Diagnosis not present

## 2024-04-27 DIAGNOSIS — E559 Vitamin D deficiency, unspecified: Secondary | ICD-10-CM | POA: Diagnosis not present

## 2024-06-05 DIAGNOSIS — N390 Urinary tract infection, site not specified: Secondary | ICD-10-CM | POA: Diagnosis not present

## 2024-06-05 DIAGNOSIS — N760 Acute vaginitis: Secondary | ICD-10-CM | POA: Diagnosis not present

## 2024-06-16 DIAGNOSIS — K644 Residual hemorrhoidal skin tags: Secondary | ICD-10-CM | POA: Diagnosis not present

## 2024-06-16 DIAGNOSIS — K6289 Other specified diseases of anus and rectum: Secondary | ICD-10-CM | POA: Diagnosis not present

## 2024-06-16 DIAGNOSIS — K648 Other hemorrhoids: Secondary | ICD-10-CM | POA: Diagnosis not present

## 2024-06-20 DIAGNOSIS — B3731 Acute candidiasis of vulva and vagina: Secondary | ICD-10-CM | POA: Diagnosis not present

## 2024-06-20 DIAGNOSIS — N760 Acute vaginitis: Secondary | ICD-10-CM | POA: Diagnosis not present

## 2024-06-20 DIAGNOSIS — N390 Urinary tract infection, site not specified: Secondary | ICD-10-CM | POA: Diagnosis not present

## 2024-06-20 DIAGNOSIS — B9689 Other specified bacterial agents as the cause of diseases classified elsewhere: Secondary | ICD-10-CM | POA: Diagnosis not present
